# Patient Record
Sex: Male | Born: 1980 | Race: Black or African American | Hispanic: No | Marital: Single | Smoking: Never smoker
Health system: Southern US, Community
[De-identification: ages and names within clinical notes are randomized; demographics above are authoritative.]

## PROBLEM LIST (undated history)

## (undated) DIAGNOSIS — K219 Gastro-esophageal reflux disease without esophagitis: Secondary | ICD-10-CM

---

## 2007-07-11 ENCOUNTER — Emergency Department: Payer: Self-pay | Admitting: Emergency Medicine

## 2007-07-17 ENCOUNTER — Emergency Department: Payer: Self-pay | Admitting: Emergency Medicine

## 2010-08-09 ENCOUNTER — Emergency Department: Payer: Self-pay | Admitting: Emergency Medicine

## 2014-08-28 ENCOUNTER — Emergency Department
Admission: EM | Admit: 2014-08-28 | Discharge: 2014-08-28 | Disposition: A | Discharge status: Home / Self Care | Payer: Medicare Other | Attending: Emergency Medicine | Admitting: Emergency Medicine

## 2014-08-28 ENCOUNTER — Encounter: Payer: Self-pay | Admitting: Emergency Medicine

## 2014-08-28 DIAGNOSIS — K088 Other specified disorders of teeth and supporting structures: Secondary | ICD-10-CM | POA: Diagnosis present

## 2014-08-28 DIAGNOSIS — K047 Periapical abscess without sinus: Secondary | ICD-10-CM | POA: Insufficient documentation

## 2014-08-28 DIAGNOSIS — K002 Abnormalities of size and form of teeth: Secondary | ICD-10-CM | POA: Insufficient documentation

## 2014-08-28 DIAGNOSIS — K029 Dental caries, unspecified: Secondary | ICD-10-CM | POA: Insufficient documentation

## 2014-08-28 MED ORDER — AMOXICILLIN 500 MG PO CAPS: 500.0000 mg | ORAL_CAPSULE | Freq: Three times a day (TID) | ORAL | Status: DC

## 2014-08-28 MED ORDER — IBUPROFEN 800 MG PO TABS: 800.0000 mg | ORAL_TABLET | Freq: Three times a day (TID) | ORAL | Status: DC | PRN

## 2014-08-28 NOTE — Discharge Instructions (Signed)

## 2014-08-28 NOTE — ED Provider Notes (Signed)
CSN: [id number]     Arrival date & time 08/28/14  7829 History   First MD Initiated Contact with Patient 08/28/14 0757     Chief Complaint  Patient presents with  . Dental Pain    HPI Comments: 34 year old male complains of left jaw pain for the past week. Patient reports that over time his face has started to swell. He has not had any fevers, ear pain, sinus congestion. He has not had any dental trauma and has no known caries. Has not taken anything for pain.  Patient is a 34 y.o. male presenting with tooth pain. The history is provided by the patient.  Dental Pain Location:  Lower Lower teeth location:  17/LL 3rd molar Quality:  Aching Severity:  Moderate Onset quality:  Gradual Duration:  7 days Timing:  Constant Progression:  Unchanged Chronicity:  New Context: dental caries and poor dentition   Context: not dental fracture, not malocclusion, not recent dental surgery and not trauma   Relieved by:  None tried Worsened by:  Hot food/drink, touching, cold food/drink, jaw movement and pressure Ineffective treatments:  None tried Associated symptoms: facial pain, facial swelling and gum swelling   Associated symptoms: no fever, no neck swelling, no oral bleeding and no oral lesions     History reviewed. No pertinent past medical history. History reviewed. No pertinent past surgical history. History reviewed. No pertinent family history. History  Substance Use Topics  . Smoking status: Never Smoker   . Smokeless tobacco: Not on file  . Alcohol Use: No    Review of Systems  Constitutional: Negative for fever and chills.  HENT: Positive for facial swelling. Negative for mouth sores.   All other systems reviewed and are negative.     Allergies  Review of patient's allergies indicates no known allergies.  Home Medications   Prior to Admission medications   Medication Sig Start Date End Date Taking? Authorizing Provider  amoxicillin (AMOXIL) 500 MG capsule Take 1  capsule (500 mg total) by mouth 3 (three) times daily. 08/28/14   Luvenia Redden, PA-C  ibuprofen (ADVIL,MOTRIN) 800 MG tablet Take 1 tablet (800 mg total) by mouth every 8 (eight) hours as needed. 08/28/14   Wilber Oliphant V, PA-C   BP 126/76 mmHg  Temp(Src) 97.8 F (36.6 C) (Oral)  Resp 18  Ht  (1.753 m)  Wt 236 lb (107.049 kg)  BMI 34.84 kg/m2  SpO2 98% Physical Exam  Constitutional: He is oriented to person, place, and time. Vital signs are normal. He appears well-developed and well-nourished. He is active.  Non-toxic appearance. He does not have a sickly appearance. He does not appear ill.  HENT:  Head: Normocephalic and atraumatic.  Right Ear: Tympanic membrane and external ear normal.  Left Ear: Tympanic membrane and external ear normal.  Nose: Nose normal.  Mouth/Throat: Uvula is midline, oropharynx is clear and moist and mucous membranes are normal. Abnormal dentition. Dental abscesses and dental caries present.    Eyes: Conjunctivae and EOM are normal. Pupils are equal, round, and reactive to light.  Neck: Normal range of motion. Neck supple.  Lymphadenopathy:    He has no cervical adenopathy.  Neurological: He is alert and oriented to person, place, and time.  Skin: Skin is warm and dry.  Psychiatric: He has a normal mood and affect. His behavior is normal. Judgment and thought content normal.  Nursing note and vitals reviewed.   ED Course  Procedures (including critical care time) Labs Review Labs  Reviewed - No data to display  Imaging Review No results found.   EKG Interpretation None      MDM  Pt given dental follow up information - amox course and motrin TId with food Final diagnoses:  Dental abscess     Luvenia ReddenEmma Weavil V, PA-C 08/28/14 0902  Wilber OliphantEmma Weavil V, PA-C 08/28/14 [id number]  Governor Rooksebecca Lord, MD 08/28/14 1023

## 2014-08-28 NOTE — ED Notes (Signed)
Tooth ache on left side of his jaw. He reports that his ear and head are starting to hurt. Slight swelling seen.

## 2019-02-07 ENCOUNTER — Other Ambulatory Visit: Payer: Self-pay

## 2019-02-07 ENCOUNTER — Emergency Department: Payer: Medicare Other

## 2019-02-07 ENCOUNTER — Encounter: Payer: Self-pay | Admitting: Intensive Care

## 2019-02-07 ENCOUNTER — Emergency Department
Admission: EM | Admit: 2019-02-07 | Discharge: 2019-02-07 | Disposition: A | Discharge status: Home / Self Care | Payer: Medicare Other | Attending: Emergency Medicine | Admitting: Emergency Medicine

## 2019-02-07 DIAGNOSIS — R079 Chest pain, unspecified: Secondary | ICD-10-CM | POA: Diagnosis not present

## 2019-02-07 LAB — CBC
HCT: 39.5 % (ref 39.0–52.0)
Hemoglobin: 13.7 g/dL (ref 13.0–17.0)
MCH: 28.8 pg (ref 26.0–34.0)
MCHC: 34.7 g/dL (ref 30.0–36.0)
MCV: 83.2 fL (ref 80.0–100.0)
Platelets: 216 10*3/uL (ref 150–400)
RBC: 4.75 MIL/uL (ref 4.22–5.81)
RDW: 12 % (ref 11.5–15.5)
WBC: 4.8 10*3/uL (ref 4.0–10.5)
nRBC: 0 % (ref 0.0–0.2)

## 2019-02-07 LAB — BASIC METABOLIC PANEL
Anion gap: 9 (ref 5–15)
BUN: 10 mg/dL (ref 6–20)
CO2: 27 mmol/L (ref 22–32)
Calcium: 9.1 mg/dL (ref 8.9–10.3)
Chloride: 104 mmol/L (ref 98–111)
Creatinine, Ser: 0.91 mg/dL (ref 0.61–1.24)
GFR calc Af Amer: >60 mL/min (ref >60)
GFR calc non Af Amer: >60 mL/min (ref >60)
Glucose, Bld: 84 mg/dL (ref 70–99)
Potassium: 3.2 mmol/L — ABNORMAL LOW (ref 3.5–5.1)
Sodium: 140 mmol/L (ref 135–145)

## 2019-02-07 LAB — TROPONIN I (HIGH SENSITIVITY)
Troponin I (High Sensitivity): 6 ng/L (ref <18)
Troponin I (High Sensitivity): 6 ng/L (ref <18)

## 2019-02-07 MED ORDER — FAMOTIDINE 20 MG PO TABS
40.0000 mg | ORAL_TABLET | Freq: Once | ORAL | Status: AC
  Administered 2019-02-07: 40 mg via ORAL
  Filled 2019-02-07: qty 2

## 2019-02-07 NOTE — ED Triage Notes (Signed)
Patient c/o central/left sided chest tightness that started last night. Radiation down left arm

## 2019-02-07 NOTE — ED Triage Notes (Signed)
First RN Note: Pt presents to ED via wheelchair from [facility] with c/o L sided CP that radiates down the L arm. Pt presents alert and oriented at this time, respirations even and unlabored.

## 2019-02-07 NOTE — ED Provider Notes (Signed)
Emergency Department Provider Note  ____________________________________________  Time seen: Approximately 3:49 PM  I have reviewed the triage vital signs and the nursing notes.   HISTORY  Chief Complaint Chest Pain   Historian Patient    HPI Dwayne Macdonald is a 38 y.o. male with an unremarkable past medical history, presents to the emergency department with midsternal chest pain that started last night.  When asked question about association of pain with eating, patient states that he has been eating more pizza lately and did notice discomfort after he had pizza.  He states that his chest pain stays the same with exertion that he can still feel discomfort when he is in a supine position.  He denies nausea, vomiting or abdominal pain.  He has been afebrile.  He denies a history of cardiac issues.  Chest pain does not worsen with deep inspiration.  No recent travel, prolonged immobilization or recent surgery.  Patient does not recall having similar episodes of chest pain in the past.  He is a non-smoker and does not take any antihypertensives.   History reviewed. No pertinent past medical history.   Immunizations up to date:  Yes.     History reviewed. No pertinent past medical history.  There are no problems to display for this patient.   History reviewed. No pertinent surgical history.  Prior to Admission medications   Medication Sig Start Date End Date Taking? Authorizing Provider  amoxicillin (AMOXIL) 500 MG capsule Take 1 capsule (500 mg total) by mouth 3 (three) times daily. 08/28/14   Harvest Dark, PA-C  ibuprofen (ADVIL,MOTRIN) 800 MG tablet Take 1 tablet (800 mg total) by mouth every 8 (eight) hours as needed. 08/28/14   Harvest Dark, PA-C    Allergies Patient has no known allergies.  History reviewed. No pertinent family history.  Social History Social History   Tobacco Use  . Smoking status: Never Smoker  . Smokeless tobacco: Never Used   Substance Use Topics  . Alcohol use: No  . Drug use: Never     Review of Systems  Constitutional: No fever/chills Eyes:  No discharge ENT: No upper respiratory complaints. Respiratory: no cough. No SOB/ use of accessory muscles to breath Cardiac: Patient has mid-sternal chest pain.  Gastrointestinal:   No nausea, no vomiting.  No diarrhea.  No constipation. Musculoskeletal: Negative for musculoskeletal pain. Skin: Negative for rash, abrasions, lacerations, ecchymosis.    ____________________________________________   PHYSICAL EXAM:  VITAL SIGNS: ED Triage Vitals [02/07/19 1310]  Enc Vitals Group     BP (!) 139/95     Pulse Rate 84     Resp 16     Temp 98.3 F (36.8 C)     Temp Source Oral     SpO2 100 %     Weight 238 lb (108 kg)     Height 5\' 9"  (1.753 m)     Head Circumference      Peak Flow      Pain Score 5     Pain Loc      Pain Edu?      Excl. in [place]?      Constitutional: Alert and oriented. Well appearing and in no acute distress. Eyes: Conjunctivae are normal. PERRL. EOMI. Head: Atraumatic. ENT:      Ears: TMs are pearly.       Nose: No congestion/rhinnorhea.      Mouth/Throat: Mucous membranes are moist.  Neck: No stridor.  No cervical spine tenderness to palpation.  Cardiovascular: Normal rate, regular rhythm. Normal S1 and S2.  Good peripheral circulation.  No reproducible anterior chest wall pain to discomfort. Respiratory: Normal respiratory effort without tachypnea or retractions. Lungs CTAB. Good air entry to the bases with no decreased or absent breath sounds Gastrointestinal: Bowel sounds x 4 quadrants. Soft and nontender to palpation. No guarding or rigidity. No distention. Musculoskeletal: Full range of motion to all extremities. No obvious deformities noted Neurologic:  Normal for age. No gross focal neurologic deficits are appreciated.  Skin:  Skin is warm, dry and intact. No rash noted. Psychiatric: Mood and affect are normal for age.  Speech and behavior are normal.   ____________________________________________   LABS (all labs ordered are listed, but only abnormal results are displayed)  Labs Reviewed  BASIC METABOLIC PANEL - Abnormal; Notable for the following components:      Result Value   Potassium 3.2 (*)    All other components within normal limits  CBC  TROPONIN I (HIGH SENSITIVITY)  TROPONIN I (HIGH SENSITIVITY)   ____________________________________________  EKG   ____________________________________________  RADIOLOGY Geraldo Pitter, personally viewed and evaluated these images (plain radiographs) as part of my medical decision making, as well as reviewing the written report by the radiologist.  DG Chest 2 View  Result Date: 02/07/2019 CLINICAL DATA:  Onset left chest pain radiating into the left arm last night. EXAM: CHEST - 2 VIEW COMPARISON:  None. FINDINGS: Lungs clear. Heart size normal. No pneumothorax or pleural fluid. No acute or focal bony abnormality. IMPRESSION: Negative chest. Electronically Signed   By: Drusilla Kanner M.D.   On: 02/07/2019 13:49    ____________________________________________    PROCEDURES  Procedure(s) performed:     Procedures     Medications  famotidine (PEPCID) tablet 40 mg (40 mg Oral Given 02/07/19 1552)     ____________________________________________   INITIAL IMPRESSION / ASSESSMENT AND PLAN / ED COURSE  Pertinent labs & imaging results that were available during my care of the patient were reviewed by me and considered in my medical decision making (see chart for details).    Assessment and Plan: Nonspecific chest pain  38 year old male with an unremarkable past medical history, presents to the emergency department with midsternal chest pain that started last night after patient had pizza.  Vital signs were reassuring at triage.  On physical exam, patient was resting comfortably in a supine position.  He had no increased work  of breathing.  No murmurs were auscultated on exam.  Differential diagnosis included STEMI, arrhythmia, GERD, COVID-19 pneumothorax PE...  EKG was reviewed which indicated normal sinus rhythm without ischemic changes or apparent arrhythmia.  Both sets of troponin were within reference range.  No evidence of cardiac enlargement, pneumothorax, consolidations or opacities on chest x-ray.  Patient is low risk for PE given Wells criteria.  Will administer famotidine and will reassess.  Patient feels much improved after Pepcid.  Patient was given strict return precautions to return with new or worsening symptoms.  All patient questions were answered.  ____________________________________________  FINAL CLINICAL IMPRESSION(S) / ED DIAGNOSES  Final diagnoses:  Chest pain, unspecified type      NEW MEDICATIONS STARTED DURING THIS VISIT:  ED Discharge Orders    None          This chart was dictated using voice recognition software/Dragon. Despite best efforts to proofread, errors can occur which can change the meaning. Any change was purely unintentional.     Orvil Feil, PA-C 02/07/19  1637    Emily FilbertWilliams, Jonathan E, MD 02/07/19 [phone]

## 2020-04-28 ENCOUNTER — Encounter: Payer: Self-pay | Admitting: Emergency Medicine

## 2020-04-28 ENCOUNTER — Emergency Department
Admission: EM | Admit: 2020-04-28 | Discharge: 2020-04-28 | Disposition: A | Discharge status: Home / Self Care | Payer: Medicare Other | Attending: Emergency Medicine | Admitting: Emergency Medicine

## 2020-04-28 ENCOUNTER — Emergency Department: Payer: Medicare Other

## 2020-04-28 ENCOUNTER — Other Ambulatory Visit: Payer: Self-pay

## 2020-04-28 DIAGNOSIS — M940 Chondrocostal junction syndrome [Tietze]: Secondary | ICD-10-CM | POA: Diagnosis not present

## 2020-04-28 DIAGNOSIS — R079 Chest pain, unspecified: Secondary | ICD-10-CM | POA: Diagnosis present

## 2020-04-28 LAB — BASIC METABOLIC PANEL
Anion gap: 8 (ref 5–15)
BUN: 7 mg/dL (ref 6–20)
CO2: 26 mmol/L (ref 22–32)
Calcium: 9.3 mg/dL (ref 8.9–10.3)
Chloride: 105 mmol/L (ref 98–111)
Creatinine, Ser: 0.98 mg/dL (ref 0.61–1.24)
GFR, Estimated: >60 mL/min (ref >60)
Glucose, Bld: 93 mg/dL (ref 70–99)
Potassium: 3.3 mmol/L — ABNORMAL LOW (ref 3.5–5.1)
Sodium: 139 mmol/L (ref 135–145)

## 2020-04-28 LAB — CBC
HCT: 37.6 % — ABNORMAL LOW (ref 39.0–52.0)
Hemoglobin: 13 g/dL (ref 13.0–17.0)
MCH: 29.5 pg (ref 26.0–34.0)
MCHC: 34.6 g/dL (ref 30.0–36.0)
MCV: 85.5 fL (ref 80.0–100.0)
Platelets: 230 10*3/uL (ref 150–400)
RBC: 4.4 MIL/uL (ref 4.22–5.81)
RDW: 11.9 % (ref 11.5–15.5)
WBC: 3.7 10*3/uL — ABNORMAL LOW (ref 4.0–10.5)
nRBC: 0 % (ref 0.0–0.2)

## 2020-04-28 LAB — TROPONIN I (HIGH SENSITIVITY): Troponin I (High Sensitivity): 6 ng/L (ref <18)

## 2020-04-28 MED ORDER — ONDANSETRON HCL 4 MG/2ML IJ SOLN: 4.0000 mg | Freq: Once | INTRAMUSCULAR | Status: DC

## 2020-04-28 MED ORDER — IBUPROFEN 600 MG PO TABS: 600.0000 mg | ORAL_TABLET | Freq: Four times a day (QID) | ORAL | 0 refills | Status: DC | PRN

## 2020-04-28 MED ORDER — POTASSIUM CHLORIDE CRYS ER 20 MEQ PO TBCR
40.0000 meq | EXTENDED_RELEASE_TABLET | Freq: Once | ORAL | Status: AC
  Administered 2020-04-28: 40 meq via ORAL
  Filled 2020-04-28: qty 2

## 2020-04-28 MED ORDER — SODIUM CHLORIDE 0.9 % IV BOLUS: 1000.0000 mL | Freq: Once | INTRAVENOUS | Status: DC

## 2020-04-28 MED ORDER — HYDROMORPHONE HCL 1 MG/ML IJ SOLN: 1.0000 mg | Freq: Once | INTRAMUSCULAR | Status: DC

## 2020-04-28 NOTE — ED Provider Notes (Signed)
[facility] Emergency Department Provider Note  ____________________________________________   Event Date/Time   First MD Initiated Contact with Patient 04/28/20 1937     (approximate)  I have reviewed the triage vital signs and the nursing notes.   HISTORY  Chief Complaint Chest Pain    HPI Dwayne Macdonald is a 40 y.o. male no prior heart history who comes in with chest pain.  Patient reports having chest pain since yesterday.  States that it started after doing some push-ups.  The chest pain is intermittent last for a few seconds, worse with pushing on his chest, has not take anything to help with the pain.  Denies any shortness of breath, coughing, fevers.  No abdominal pain.  Denies any other risk factors for PE.          History reviewed. No pertinent past medical history.  There are no problems to display for this patient.   History reviewed. No pertinent surgical history.  Prior to Admission medications   Medication Sig Start Date End Date Taking? Authorizing Provider  amoxicillin (AMOXIL) 500 MG capsule Take 1 capsule (500 mg total) by mouth 3 (three) times daily. 08/28/14   Christella Scheuermann, PA-C  ibuprofen (ADVIL,MOTRIN) 800 MG tablet Take 1 tablet (800 mg total) by mouth every 8 (eight) hours as needed. 08/28/14   Christella Scheuermann, PA-C    Allergies Patient has no known allergies.  No family history on file.  Social History Social History   Tobacco Use  . Smoking status: Never Smoker  . Smokeless tobacco: Never Used  Substance Use Topics  . Alcohol use: No  . Drug use: Never      Review of Systems Constitutional: No fever/chills Eyes: No visual changes. ENT: No sore throat. Cardiovascular: Positive chest pain Respiratory: Denies shortness of breath. Gastrointestinal: No abdominal pain.  No nausea, no vomiting.  No diarrhea.  No constipation. Genitourinary: Negative for dysuria. Musculoskeletal: Negative for back  pain. Skin: Negative for rash. Neurological: Negative for headaches, focal weakness or numbness. All other ROS negative ____________________________________________   PHYSICAL EXAM:  VITAL SIGNS: ED Triage Vitals  Enc Vitals Group     BP 04/28/20 1831 137/86     Pulse Rate 04/28/20 1831 84     Resp 04/28/20 1831 17     Temp 04/28/20 1831 97.7 F (36.5 C)     Temp Source 04/28/20 1831 Oral     SpO2 04/28/20 1831 100 %     Weight 04/28/20 1709 230 lb (104.3 kg)     Height 04/28/20 1709 6' (1.829 m)     Head Circumference --      Peak Flow --      Pain Score 04/28/20 1709 5     Pain Loc --      Pain Edu? --      Excl. in [place]? --     Constitutional: Alert and oriented. Well appearing and in no acute distress. Eyes: Conjunctivae are normal. EOMI. Head: Atraumatic. Nose: No congestion/rhinnorhea. Mouth/Throat: Mucous membranes are moist.   Neck: No stridor. Trachea Midline. FROM Cardiovascular: Normal rate, regular rhythm. Grossly normal heart sounds.  Good peripheral circulation.  Chest wall tenderness midsternal, no rash Respiratory: Normal respiratory effort.  No retractions. Lungs CTAB. Gastrointestinal: Soft and nontender. No distention. No abdominal bruits.  Musculoskeletal: No lower extremity tenderness nor edema.  No joint effusions. Neurologic:  Normal speech and language. No gross focal neurologic deficits are appreciated.  Skin:  Skin  is warm, dry and intact. No rash noted. Psychiatric: Mood and affect are normal. Speech and behavior are normal. GU: Deferred   ____________________________________________   LABS (all labs ordered are listed, but only abnormal results are displayed)  Labs Reviewed  BASIC METABOLIC PANEL - Abnormal; Notable for the following components:      Result Value   Potassium 3.3 (*)    All other components within normal limits  CBC - Abnormal; Notable for the following components:   WBC 3.7 (*)    HCT 37.6 (*)    All other components  within normal limits  TROPONIN I (HIGH SENSITIVITY)  TROPONIN I (HIGH SENSITIVITY)   ____________________________________________   ED ECG REPORT I, Concha Se, the attending physician, personally viewed and interpreted this ECG.  Normal sinus rate of 88, no ST elevation, no T wave inversions, normal intervals ____________________________________________  RADIOLOGY Vela Prose, personally viewed and evaluated these images (plain radiographs) as part of my medical decision making, as well as reviewing the written report by the radiologist.  ED MD interpretation: No pneumonia noted  Official radiology report(s): DG Chest 2 View  Result Date: 04/28/2020 CLINICAL DATA:  Chest pain. EXAM: CHEST - 2 VIEW COMPARISON:  Prior chest radiographs 02/07/2019 FINDINGS: Heart size within normal limits. No appreciable airspace consolidation. No evidence of pleural effusion or pneumothorax. No acute bony abnormality identified. IMPRESSION: No evidence of active cardiopulmonary disease. Electronically Signed   By: Jackey Loge DO   On: 04/28/2020 17:48    ____________________________________________   PROCEDURES  Procedure(s) performed (including Critical Care):  Procedures   ____________________________________________   INITIAL IMPRESSION / ASSESSMENT AND PLAN / ED COURSE   Dwayne Macdonald was evaluated in Emergency Department on 04/28/2020 for the symptoms described in the history of present illness. He was evaluated in the context of the global COVID-19 pandemic, which necessitated consideration that the patient might be at risk for infection with the SARS-CoV-2 virus that causes COVID-19. Institutional protocols and algorithms that pertain to the evaluation of patients at risk for COVID-19 are in a state of rapid change based on information released by regulatory bodies including the CDC and federal and state organizations. These policies and algorithms were followed during the  patient's care in the ED.    Most Likely DDx:  -MSK (atypical chest pain) but will get cardiac markers to evaluate for ACS given risk factors/age    DDx that was also considered d/t potential to cause harm, but was found less likely based on history and physical (as detailed above): -PNA (no fevers, cough but CXR to evaluate) -PNX (reassured with equal b/l breath sounds, CXR to evaluate) -Symptomatic anemia (will get H&H) -Pulmonary embolism as no sob at rest, not pleuritic in nature, no hypoxia, PERC negative -Aortic Dissection as no tearing pain and no radiation to the mid back, pulses equal -Pericarditis no rub on exam, EKG changes or hx to suggest dx -Tamponade (no notable SOB, tachycardic, hypotensive) -Esophageal rupture (no h/o diffuse vomitting/no crepitus) -No upper abdominal pain on palpation to suggest gallbladder pathology, gastritis.  Cardiac marker was negative and patient is a low heart score and chest pain is been going on for over a day.  I do not feel the patient needs repeat given my low suspicion given is very musculoskeletal in nature.  White count slightly low but no signs of infection.  Potassium also slightly low and will give some oral repletion.  Patient can have this followed up with  his primary care doctor      ____________________________________________   FINAL CLINICAL IMPRESSION(S) / ED DIAGNOSES   Final diagnoses:  Costochondritis     MEDICATIONS GIVEN DURING THIS VISIT:  Medications  potassium chloride SA (KLOR-CON) CR tablet 40 mEq (has no administration in time range)     ED Discharge Orders         Ordered    ibuprofen (ADVIL) 600 MG tablet  Every 6 hours PRN        04/28/20 1958           Note:  This document was prepared using Dragon voice recognition software and may include unintentional dictation errors.   Concha Se, MD 04/28/20 [place]

## 2020-04-28 NOTE — ED Triage Notes (Addendum)
Patient presents to the ED with intermittent chest pain that began yesterday.  Patient states pain is sharp and is on the center and left side of his chest.  Patient reports tenderness to the middle of his chest on palpation.  Patient is in no obvious distress at this time and denies any cardiac history.  Patient reports performing 10 push ups last night.

## 2020-04-28 NOTE — Discharge Instructions (Signed)
We provide an anti-inflammatory to help with your chest pain.  Take this with food.  Return to the ER if you develop worsening shortness of breath or any other concerns.  Your white count and potassium are slightly low.  He can follow this up with your primary care doctor in 1 week.

## 2020-05-01 ENCOUNTER — Emergency Department: Payer: Medicare Other

## 2020-05-01 ENCOUNTER — Other Ambulatory Visit: Payer: Self-pay

## 2020-05-01 DIAGNOSIS — R0789 Other chest pain: Secondary | ICD-10-CM | POA: Diagnosis present

## 2020-05-01 LAB — BASIC METABOLIC PANEL
Anion gap: 8 (ref 5–15)
BUN: 10 mg/dL (ref 6–20)
CO2: 27 mmol/L (ref 22–32)
Calcium: 9.1 mg/dL (ref 8.9–10.3)
Chloride: 106 mmol/L (ref 98–111)
Creatinine, Ser: 1.06 mg/dL (ref 0.61–1.24)
GFR, Estimated: >60 mL/min (ref >60)
Glucose, Bld: 99 mg/dL (ref 70–99)
Potassium: 3.5 mmol/L (ref 3.5–5.1)
Sodium: 141 mmol/L (ref 135–145)

## 2020-05-01 LAB — CBC
HCT: 36.8 % — ABNORMAL LOW (ref 39.0–52.0)
Hemoglobin: 12.3 g/dL — ABNORMAL LOW (ref 13.0–17.0)
MCH: 29.1 pg (ref 26.0–34.0)
MCHC: 33.4 g/dL (ref 30.0–36.0)
MCV: 87 fL (ref 80.0–100.0)
Platelets: 212 10*3/uL (ref 150–400)
RBC: 4.23 MIL/uL (ref 4.22–5.81)
RDW: 11.9 % (ref 11.5–15.5)
WBC: 3.9 10*3/uL — ABNORMAL LOW (ref 4.0–10.5)
nRBC: 0 % (ref 0.0–0.2)

## 2020-05-01 LAB — TROPONIN I (HIGH SENSITIVITY): Troponin I (High Sensitivity): 8 ng/L (ref <18)

## 2020-05-01 NOTE — ED Triage Notes (Signed)
Pt to ED via EMS from home. Pt states for the past 2 days he has had intermittent sharp chest pain, that goes from left chest to neck. Pt has no cardiac hx.

## 2020-05-02 ENCOUNTER — Emergency Department
Admission: EM | Admit: 2020-05-02 | Discharge: 2020-05-02 | Disposition: A | Discharge status: Home / Self Care | Payer: Medicare Other | Attending: Emergency Medicine | Admitting: Emergency Medicine

## 2020-05-02 DIAGNOSIS — R0789 Other chest pain: Secondary | ICD-10-CM | POA: Diagnosis not present

## 2020-05-02 LAB — TROPONIN I (HIGH SENSITIVITY): Troponin I (High Sensitivity): 8 ng/L (ref <18)

## 2020-05-02 NOTE — ED Provider Notes (Signed)
[facility] Emergency Department Provider Note  ____________________________________________   Event Date/Time   First MD Initiated Contact with Patient 05/02/20 0101     (approximate)  I have reviewed the triage vital signs and the nursing notes.   HISTORY  Chief Complaint Chest Pain    HPI Dwayne Macdonald is a 40 y.o. male reports no chronic medical issues and presents for evaluation of some sharp or sometimes aching pain in his anterior chest.  He said it started 2 days ago after doing 10 push-ups.  He said the pain seems to be moving a little bit, starting in the middle and moving up to the left part of his chest over the last couple of days.  He said he does not feel it at all right now but he can feel it if he pushes on his chest.  He has had no other symptoms; specifically he denies fever, sore throat, shortness of breath, cough, nausea, vomiting, abdominal pain.  He has no pain in his arms or his legs.  The onset of the pain was acute and it is mild, and nothing in particular makes it better and pushing on his chest wall seems to make it worse.  He reports no history of high blood pressure, diabetes, high cholesterol, smoking, nor drug use.  He said no one in his family  has had heart attacks of which he is aware.        History reviewed. No pertinent past medical history.  There are no problems to display for this patient.   History reviewed. No pertinent surgical history.  Prior to Admission medications   Medication Sig Start Date End Date Taking? Authorizing Provider  amoxicillin (AMOXIL) 500 MG capsule Take 1 capsule (500 mg total) by mouth 3 (three) times daily. 08/28/14   Christella Scheuermann, PA-C  ibuprofen (ADVIL) 600 MG tablet Take 1 tablet (600 mg total) by mouth every 6 (six) hours as needed for up to 7 days. 04/28/20 05/05/20  Concha Se, MD    Allergies Patient has no known allergies.  No family history on file.  Social  History Social History   Tobacco Use  . Smoking status: Never Smoker  . Smokeless tobacco: Never Used  Substance Use Topics  . Alcohol use: No  . Drug use: Never    Review of Systems Constitutional: No fever/chills Eyes: No visual changes. ENT: No sore throat. Cardiovascular: Anterior chest wall pain. Respiratory: Denies shortness of breath. Gastrointestinal: No abdominal pain.  No nausea, no vomiting.  No diarrhea.  No constipation. Genitourinary: Negative for dysuria. Musculoskeletal: Negative for neck pain.  Negative for back pain. Integumentary: Negative for rash. Neurological: Negative for headaches, focal weakness or numbness.   ____________________________________________   PHYSICAL EXAM:  VITAL SIGNS: ED Triage Vitals  Enc Vitals Group     BP 05/01/20 2131 134/87     Pulse Rate 05/01/20 2131 80     Resp 05/01/20 2131 18     Temp 05/01/20 2131 98.3 F (36.8 C)     Temp Source 05/01/20 2348 Oral     SpO2 05/01/20 2131 98 %     Weight 05/01/20 2129 104.3 kg (230 lb)     Height 05/01/20 2129 1.829 m (6')     Head Circumference --      Peak Flow --      Pain Score 05/01/20 2129 0     Pain Loc --      Pain Edu? --  Excl. in GC? --     Constitutional: Alert and oriented.  Eyes: Conjunctivae are normal.  Head: Atraumatic. Nose: No congestion/rhinnorhea. Mouth/Throat: Patient is wearing a mask. Neck: No stridor.  No meningeal signs.   Cardiovascular: Normal rate, regular rhythm. Good peripheral circulation. Respiratory: Normal respiratory effort.  No retractions. Gastrointestinal: Soft and nontender. No distention.  Musculoskeletal: Patient has some mild reproducible tenderness palpation of the left anterior chest wall.  No lower extremity tenderness nor edema. No gross deformities of extremities. Neurologic:  Normal speech and language. No gross focal neurologic deficits are appreciated.  Skin:  Skin is warm, dry and intact. Psychiatric: Mood and  affect are normal. Speech and behavior are normal.  ____________________________________________   LABS (all labs ordered are listed, but only abnormal results are displayed)  Labs Reviewed  CBC - Abnormal; Notable for the following components:      Result Value   WBC 3.9 (*)    Hemoglobin 12.3 (*)    HCT 36.8 (*)    All other components within normal limits  BASIC METABOLIC PANEL  TROPONIN I (HIGH SENSITIVITY)  TROPONIN I (HIGH SENSITIVITY)   ____________________________________________  EKG  ED ECG REPORT I, Loleta Rose, the attending physician, personally viewed and interpreted this ECG.  Date: 05/01/2020 EKG Time: 21: 37 Rate: 80 Rhythm: normal sinus rhythm QRS Axis: normal Intervals: normal ST/T Wave abnormalities: normal Narrative Interpretation: no evidence of acute ischemia  ____________________________________________  RADIOLOGY I, Loleta Rose, personally viewed and evaluated these images (plain radiographs) as part of my medical decision making, as well as reviewing the written report by the radiologist.  ED MD interpretation: No acute abnormalities on chest x-ray  Official radiology report(s): DG Chest 2 View  Result Date: 05/01/2020 CLINICAL DATA:  Intermittent sharp chest pain radiating to neck EXAM: CHEST - 2 VIEW COMPARISON:  04/28/2020 FINDINGS: The heart size and mediastinal contours are within normal limits. Both lungs are clear. The visualized skeletal structures are unremarkable. IMPRESSION: No active cardiopulmonary disease. Electronically Signed   By: Sharlet Salina M.D.   On: 05/01/2020 22:03    ____________________________________________   PROCEDURES   Procedure(s) performed (including Critical Care):  Procedures   ____________________________________________   INITIAL IMPRESSION / MDM / ASSESSMENT AND PLAN / ED COURSE  As part of my medical decision making, I reviewed the following data within the electronic MEDICAL RECORD NUMBER  Nursing notes reviewed and incorporated, Labs reviewed , EKG interpreted , Old chart reviewed, Radiograph reviewed  and Notes from prior ED visits   Differential diagnosis includes, but is not limited to, musculoskeletal strain, costochondritis, ACS, PE, pneumonia.  Patient is well-appearing and in no distress.  Vital signs are stable and within normal limits. Two HS troponins of 8.  Basic metabolic panel and CBC are essentially normal.  I personally reviewed the patient's imaging and agree with the radiologist's interpretation that there are no acute abnormalities on the chest x-ray.  EKG is normal with no sign of ischemia.  Patient has reproducible chest wall tenderness that started after he did some push-ups.  Low risk for ACS based on HEAR score.  PERC negative.  No indication of an emergent medical condition tonight.  I encouraged the patient to take some ibuprofen for his chest wall pain and follow-up with his regular doctor.  I explained to him that there is no evidence of an emergent condition tonight and he said that he understood.  I gave my usual and customary return precautions.  ____________________________________________  FINAL CLINICAL IMPRESSION(S) / ED DIAGNOSES  Final diagnoses:  Chest wall pain     MEDICATIONS GIVEN DURING THIS VISIT:  Medications - No data to display   ED Discharge Orders    None      *Please note:  Dwayne Macdonald was evaluated in Emergency Department on 05/02/2020 for the symptoms described in the history of present illness. He was evaluated in the context of the global COVID-19 pandemic, which necessitated consideration that the patient might be at risk for infection with the SARS-CoV-2 virus that causes COVID-19. Institutional protocols and algorithms that pertain to the evaluation of patients at risk for COVID-19 are in a state of rapid change based on information released by regulatory bodies including the CDC and federal and state  organizations. These policies and algorithms were followed during the patient's care in the ED.  Some ED evaluations and interventions may be delayed as a result of limited staffing during and after the pandemic.*  Note:  This document was prepared using Dragon voice recognition software and may include unintentional dictation errors.   Loleta Rose, MD 05/02/20 [phone]

## 2020-05-02 NOTE — Discharge Instructions (Addendum)

## 2020-05-03 ENCOUNTER — Emergency Department: Payer: Medicare Other

## 2020-05-03 ENCOUNTER — Emergency Department
Admission: EM | Admit: 2020-05-03 | Discharge: 2020-05-03 | Disposition: A | Discharge status: Home / Self Care | Payer: Medicare Other | Attending: Emergency Medicine | Admitting: Emergency Medicine

## 2020-05-03 ENCOUNTER — Other Ambulatory Visit: Payer: Self-pay

## 2020-05-03 DIAGNOSIS — R079 Chest pain, unspecified: Secondary | ICD-10-CM | POA: Diagnosis present

## 2020-05-03 DIAGNOSIS — K219 Gastro-esophageal reflux disease without esophagitis: Secondary | ICD-10-CM | POA: Insufficient documentation

## 2020-05-03 LAB — CBC
HCT: 36.8 % — ABNORMAL LOW (ref 39.0–52.0)
Hemoglobin: 12.7 g/dL — ABNORMAL LOW (ref 13.0–17.0)
MCH: 29.7 pg (ref 26.0–34.0)
MCHC: 34.5 g/dL (ref 30.0–36.0)
MCV: 86 fL (ref 80.0–100.0)
Platelets: 215 10*3/uL (ref 150–400)
RBC: 4.28 MIL/uL (ref 4.22–5.81)
RDW: 12.1 % (ref 11.5–15.5)
WBC: 3.6 10*3/uL — ABNORMAL LOW (ref 4.0–10.5)
nRBC: 0 % (ref 0.0–0.2)

## 2020-05-03 LAB — BASIC METABOLIC PANEL
Anion gap: 8 (ref 5–15)
BUN: 7 mg/dL (ref 6–20)
CO2: 25 mmol/L (ref 22–32)
Calcium: 9.2 mg/dL (ref 8.9–10.3)
Chloride: 108 mmol/L (ref 98–111)
Creatinine, Ser: 0.91 mg/dL (ref 0.61–1.24)
GFR, Estimated: >60 mL/min (ref >60)
Glucose, Bld: 99 mg/dL (ref 70–99)
Potassium: 3.3 mmol/L — ABNORMAL LOW (ref 3.5–5.1)
Sodium: 141 mmol/L (ref 135–145)

## 2020-05-03 LAB — TROPONIN I (HIGH SENSITIVITY): Troponin I (High Sensitivity): 7 ng/L (ref <18)

## 2020-05-03 MED ORDER — LIDOCAINE VISCOUS HCL 2 % MT SOLN
15.0000 mL | Freq: Once | OROMUCOSAL | Status: AC
  Administered 2020-05-03: 15 mL via ORAL
  Filled 2020-05-03: qty 15

## 2020-05-03 MED ORDER — OMEPRAZOLE 20 MG PO CPDR: 20.0000 mg | DELAYED_RELEASE_CAPSULE | Freq: Every day | ORAL | 1 refills | Status: DC

## 2020-05-03 MED ORDER — ALUM & MAG HYDROXIDE-SIMETH 200-200-20 MG/5ML PO SUSP
15.0000 mL | Freq: Once | ORAL | Status: AC
  Administered 2020-05-03: 15 mL via ORAL
  Filled 2020-05-03: qty 30

## 2020-05-03 NOTE — ED Provider Notes (Signed)
[facility] Emergency Department Provider Note   ____________________________________________   Event Date/Time   First MD Initiated Contact with Patient 05/03/20 [phone]     (approximate)  I have reviewed the triage vital signs and the nursing notes.   HISTORY  Chief Complaint Chest Pain    HPI Dwayne Macdonald is a 40 y.o. male with no significant past medical history who presents to the ED complaining of chest pain.  Patient reports intermittent discomfort in the center of his chest over the past 2 days.  He describes it as a "swimmy feeling" that seems to be worse when he goes to lie flat.  He states it will sometimes feel like something is bubbling up into his chest, but he denies any nausea or vomiting.  He also denies any fevers, cough, or shortness of breath.  He was seen in the ED for similar symptoms last night, but states symptoms have persisted since then.        History reviewed. No pertinent past medical history.  There are no problems to display for this patient.   History reviewed. No pertinent surgical history.  Prior to Admission medications   Medication Sig Start Date End Date Taking? Authorizing Provider  omeprazole (PRILOSEC) 20 MG capsule Take 1 capsule (20 mg total) by mouth daily. 05/03/20 05/03/21 Yes Chesley Noon, MD  amoxicillin (AMOXIL) 500 MG capsule Take 1 capsule (500 mg total) by mouth 3 (three) times daily. 08/28/14   Christella Scheuermann, PA-C  ibuprofen (ADVIL) 600 MG tablet Take 1 tablet (600 mg total) by mouth every 6 (six) hours as needed for up to 7 days. 04/28/20 05/05/20  Concha Se, MD    Allergies Patient has no known allergies.  No family history on file.  Social History Social History   Tobacco Use  . Smoking status: Never Smoker  . Smokeless tobacco: Never Used  Substance Use Topics  . Alcohol use: No  . Drug use: Never    Review of Systems  Constitutional: No fever/chills Eyes: No visual  changes. ENT: No sore throat. Cardiovascular: Positive for chest pain. Respiratory: Denies shortness of breath. Gastrointestinal: No abdominal pain.  No nausea, no vomiting.  No diarrhea.  No constipation. Genitourinary: Negative for dysuria. Musculoskeletal: Negative for back pain. Skin: Negative for rash. Neurological: Negative for headaches, focal weakness or numbness.  ____________________________________________   PHYSICAL EXAM:  VITAL SIGNS: ED Triage Vitals  Enc Vitals Group     BP 05/03/20 0416 (!) 151/94     Pulse Rate 05/03/20 0416 80     Resp 05/03/20 0416 18     Temp 05/03/20 0416 98.4 F (36.9 C)     Temp Source 05/03/20 0416 Oral     SpO2 05/03/20 0416 97 %     Weight 05/03/20 0417 229 lb 4.5 oz (104 kg)     Height 05/03/20 0417 6' (1.829 m)     Head Circumference --      Peak Flow --      Pain Score 05/03/20 0417 6     Pain Loc --      Pain Edu? --      Excl. in GC? --     Constitutional: Alert and oriented. Eyes: Conjunctivae are normal. Head: Atraumatic. Nose: No congestion/rhinnorhea. Mouth/Throat: Mucous membranes are moist. Neck: Normal ROM Cardiovascular: Normal rate, regular rhythm. Grossly normal heart sounds. Respiratory: Normal respiratory effort.  No retractions. Lungs CTAB. Gastrointestinal: Soft and nontender. No distention. Genitourinary: deferred  Musculoskeletal: No lower extremity tenderness nor edema. Neurologic:  Normal speech and language. No gross focal neurologic deficits are appreciated. Skin:  Skin is warm, dry and intact. No rash noted. Psychiatric: Mood and affect are normal. Speech and behavior are normal.  ____________________________________________   LABS (all labs ordered are listed, but only abnormal results are displayed)  Labs Reviewed  BASIC METABOLIC PANEL - Abnormal; Notable for the following components:      Result Value   Potassium 3.3 (*)    All other components within normal limits  CBC - Abnormal;  Notable for the following components:   WBC 3.6 (*)    Hemoglobin 12.7 (*)    HCT 36.8 (*)    All other components within normal limits  TROPONIN I (HIGH SENSITIVITY)   ____________________________________________  EKG  ED ECG REPORT I, Chesley Noon, the attending physician, personally viewed and interpreted this ECG.   Date: 05/03/2020  EKG Time: 4:19  Rate: 78  Rhythm: normal sinus rhythm  Axis: Normal  Intervals:none  ST&T Change: None   PROCEDURES  Procedure(s) performed (including Critical Care):  Procedures   ____________________________________________   INITIAL IMPRESSION / ASSESSMENT AND PLAN / ED COURSE       40 year old male with no significant past medical history presents to the ED complaining of intermittent chest pain for the past 2 days that he describes as a "swimmy feeling."  Symptoms sound consistent with GERD given it is worse when he lies flat and will occasionally feel like a bubbling up into his chest.  EKG shows no evidence of arrhythmia or ischemia and troponin is negative, I doubt ACS.  Chest x-ray reviewed by me and shows no infiltrate, edema, or effusion.  Patient feels better following GI cocktail and is appropriate for discharge home with PCP follow-up.  We will start him on omeprazole and he was counseled to return to the ED for new or worsening symptoms, patient agrees with plan.      ____________________________________________   FINAL CLINICAL IMPRESSION(S) / ED DIAGNOSES  Final diagnoses:  Nonspecific chest pain  Gastroesophageal reflux disease, unspecified whether esophagitis present     ED Discharge Orders         Ordered    omeprazole (PRILOSEC) 20 MG capsule  Daily        05/03/20 0648           Note:  This document was prepared using Dragon voice recognition software and may include unintentional dictation errors.   Chesley Noon, MD 05/03/20 [phone]

## 2020-05-03 NOTE — ED Triage Notes (Signed)
BIB [facility] due to chest pain X 2 days. Pt seen for same 05/01/20. Reports pain as a "swimmy feeling" to center and left side of chest. No SOB Pt alert and oriented X4, cooperative, RR even and unlabored, color WNL. Pt in NAD. CBG 103 with EMS.

## 2020-05-04 ENCOUNTER — Other Ambulatory Visit: Payer: Self-pay

## 2020-05-04 ENCOUNTER — Emergency Department
Admission: EM | Admit: 2020-05-04 | Discharge: 2020-05-04 | Disposition: A | Discharge status: Home / Self Care | Payer: Medicare Other | Attending: Emergency Medicine | Admitting: Emergency Medicine

## 2020-05-04 ENCOUNTER — Encounter: Payer: Self-pay | Admitting: Emergency Medicine

## 2020-05-04 ENCOUNTER — Ambulatory Visit
Admission: EM | Admit: 2020-05-04 | Discharge: 2020-05-04 | Disposition: A | Discharge status: Home / Self Care | Payer: Medicare Other | Attending: Internal Medicine | Admitting: Internal Medicine

## 2020-05-04 ENCOUNTER — Emergency Department
Admission: EM | Admit: 2020-05-04 | Discharge: 2020-05-05 | Disposition: A | Discharge status: Home / Self Care | Payer: Medicare Other | Source: Home / Self Care | Attending: Emergency Medicine | Admitting: Emergency Medicine

## 2020-05-04 DIAGNOSIS — K219 Gastro-esophageal reflux disease without esophagitis: Secondary | ICD-10-CM

## 2020-05-04 DIAGNOSIS — R0789 Other chest pain: Secondary | ICD-10-CM | POA: Diagnosis present

## 2020-05-04 DIAGNOSIS — K2100 Gastro-esophageal reflux disease with esophagitis, without bleeding: Secondary | ICD-10-CM

## 2020-05-04 LAB — URINALYSIS, COMPLETE (UACMP) WITH MICROSCOPIC
Bacteria, UA: NONE SEEN
Bilirubin Urine: NEGATIVE
Glucose, UA: NEGATIVE mg/dL
Hgb urine dipstick: NEGATIVE
Ketones, ur: 20 mg/dL — AB
Leukocytes,Ua: NEGATIVE
Nitrite: NEGATIVE
Protein, ur: NEGATIVE mg/dL
Specific Gravity, Urine: 1.01 (ref 1.005–1.030)
pH: 6 (ref 5.0–8.0)

## 2020-05-04 LAB — HEPATIC FUNCTION PANEL
ALT: 23 U/L (ref 0–44)
AST: 22 U/L (ref 15–41)
Albumin: 4.5 g/dL (ref 3.5–5.0)
Alkaline Phosphatase: 62 U/L (ref 38–126)
Bilirubin, Direct: 0.1 mg/dL (ref 0.0–0.2)
Indirect Bilirubin: 0.9 mg/dL (ref 0.3–0.9)
Total Bilirubin: 1 mg/dL (ref 0.3–1.2)
Total Protein: 7.1 g/dL (ref 6.5–8.1)

## 2020-05-04 LAB — COMPREHENSIVE METABOLIC PANEL
ALT: 24 U/L (ref 0–44)
AST: 28 U/L (ref 15–41)
Albumin: 4.7 g/dL (ref 3.5–5.0)
Alkaline Phosphatase: 66 U/L (ref 38–126)
Anion gap: 9 (ref 5–15)
BUN: 12 mg/dL (ref 6–20)
CO2: 23 mmol/L (ref 22–32)
Calcium: 9 mg/dL (ref 8.9–10.3)
Chloride: 104 mmol/L (ref 98–111)
Creatinine, Ser: 0.89 mg/dL (ref 0.61–1.24)
GFR, Estimated: >60 mL/min (ref >60)
Glucose, Bld: 97 mg/dL (ref 70–99)
Potassium: 3 mmol/L — ABNORMAL LOW (ref 3.5–5.1)
Sodium: 136 mmol/L (ref 135–145)
Total Bilirubin: 1.4 mg/dL — ABNORMAL HIGH (ref 0.3–1.2)
Total Protein: 7.6 g/dL (ref 6.5–8.1)

## 2020-05-04 LAB — D-DIMER, QUANTITATIVE: D-Dimer, Quant: 0.29 ug/mL-FEU (ref 0.00–0.50)

## 2020-05-04 LAB — CBC
HCT: 35.5 % — ABNORMAL LOW (ref 39.0–52.0)
Hemoglobin: 12.1 g/dL — ABNORMAL LOW (ref 13.0–17.0)
MCH: 29.3 pg (ref 26.0–34.0)
MCHC: 34.1 g/dL (ref 30.0–36.0)
MCV: 86 fL (ref 80.0–100.0)
Platelets: 218 10*3/uL (ref 150–400)
RBC: 4.13 MIL/uL — ABNORMAL LOW (ref 4.22–5.81)
RDW: 12 % (ref 11.5–15.5)
WBC: 5.4 10*3/uL (ref 4.0–10.5)
nRBC: 0 % (ref 0.0–0.2)

## 2020-05-04 LAB — LIPASE, BLOOD
Lipase: 29 U/L (ref 11–51)
Lipase: 28 U/L (ref 11–51)

## 2020-05-04 LAB — TROPONIN I (HIGH SENSITIVITY): Troponin I (High Sensitivity): 9 ng/L (ref <18)

## 2020-05-04 MED ORDER — ALUM & MAG HYDROXIDE-SIMETH 200-200-20 MG/5ML PO SUSP
30.0000 mL | Freq: Once | ORAL | Status: AC
  Administered 2020-05-04: 30 mL via ORAL

## 2020-05-04 MED ORDER — LIDOCAINE VISCOUS HCL 2 % MT SOLN
15.0000 mL | Freq: Once | OROMUCOSAL | Status: AC
  Administered 2020-05-04: 15 mL via ORAL

## 2020-05-04 MED ORDER — KETOROLAC TROMETHAMINE 30 MG/ML IJ SOLN
30.0000 mg | Freq: Once | INTRAMUSCULAR | Status: AC
  Administered 2020-05-04: 30 mg via INTRAVENOUS
  Filled 2020-05-04: qty 1

## 2020-05-04 NOTE — ED Provider Notes (Signed)
[facility] Emergency Department Provider Note  ____________________________________________   Event Date/Time   First MD Initiated Contact with Patient 05/04/20 [phone]     (approximate)  I have reviewed the triage vital signs and the nursing notes.   HISTORY  Chief Complaint Chest Pain    HPI Dwayne Macdonald is a 40 y.o. male with history of disability per PCP notes who presents to the emergency department for the fourth time in 1 week for complaints of central chest pain without radiation.  He describes it as a "swirling" inside of his chest and feels like something is "eating away" in his chest.  He states he does get some tightness and pressure mostly with sitting still.  He denies that his pain is exertional, pleuritic or related to food.  He denies any calf tenderness or calf swelling.  No associated shortness of breath, nausea, vomiting, fevers or cough.  States he became concerned tonight and called 911 because he started having lower back pain.  No injury to the back.  No numbness, weakness, bowel or bladder incontinence, urinary retention.  He is also asking for an abdominal x-ray because he states he feels "swirling" in his abdomen as well.  No diarrhea.  No dysuria, hematuria, penile discharge.  Patient lives at home with his mother.        History reviewed. No pertinent past medical history.  There are no problems to display for this patient.   History reviewed. No pertinent surgical history.  Prior to Admission medications   Medication Sig Start Date End Date Taking? Authorizing Provider  amoxicillin (AMOXIL) 500 MG capsule Take 1 capsule (500 mg total) by mouth 3 (three) times daily. 08/28/14   Christella Scheuermann, PA-C  ibuprofen (ADVIL) 600 MG tablet Take 1 tablet (600 mg total) by mouth every 6 (six) hours as needed for up to 7 days. 04/28/20 05/05/20  Concha Se, MD  omeprazole (PRILOSEC) 20 MG capsule Take 1 capsule (20 mg total) by mouth  daily. 05/03/20 05/03/21  Chesley Noon, MD    Allergies Patient has no known allergies.  History reviewed. No pertinent family history.  Social History Social History   Tobacco Use  . Smoking status: Never Smoker  . Smokeless tobacco: Never Used  Substance Use Topics  . Alcohol use: No  . Drug use: Never    Review of Systems Constitutional: No fever. Eyes: No visual changes. ENT: No sore throat. Cardiovascular: + chest pain. Respiratory: Denies shortness of breath. Gastrointestinal: No nausea, vomiting, diarrhea. Genitourinary: Negative for dysuria. Musculoskeletal: Negative for back pain. Skin: Negative for rash. Neurological: Negative for focal weakness or numbness.  ____________________________________________   PHYSICAL EXAM:  VITAL SIGNS: ED Triage Vitals  Enc Vitals Group     BP 05/04/20 0246 (!) 149/85     Pulse Rate 05/04/20 0246 80     Resp 05/04/20 0246 18     Temp 05/04/20 0246 98.1 F (36.7 C)     Temp src --      SpO2 05/04/20 0246 96 %     Weight 05/04/20 0247 230 lb (104.3 kg)     Height 05/04/20 0247 6' (1.829 m)     Head Circumference --      Peak Flow --      Pain Score 05/04/20 0247 5     Pain Loc --      Pain Edu? --      Excl. in GC? --    CONSTITUTIONAL:  Alert and oriented and responds appropriately to questions. Well-appearing; well-nourished HEAD: Normocephalic EYES: Conjunctivae clear, pupils appear equal, EOM appear intact ENT: normal nose; moist mucous membranes NECK: Supple, normal ROM CARD: RRR; S1 and S2 appreciated; no murmurs, no clicks, no rubs, no gallops RESP: Normal chest excursion without splinting or tachypnea; breath sounds clear and equal bilaterally; no wheezes, no rhonchi, no rales, no hypoxia or respiratory distress, speaking full sentences ABD/GI: Normal bowel sounds; non-distended; soft, non-tender, no rebound, no guarding, no peritoneal signs, no hepatosplenomegaly BACK: The back appears normal, no midline  spinal tenderness or step-off or deformity EXT: Normal ROM in all joints; no deformity noted, no edema; no cyanosis, no calf tenderness or calf swelling SKIN: Normal color for age and race; warm; no rash on exposed skin NEURO: Moves all extremities equally, normal speech, no facial asymmetry, normal sensation diffusely without saddle anesthesia, no hyperreflexia PSYCH: The patient's mood and manner are appropriate.  ____________________________________________   LABS (all labs ordered are listed, but only abnormal results are displayed)  Labs Reviewed  D-DIMER, QUANTITATIVE  HEPATIC FUNCTION PANEL  LIPASE, BLOOD  TROPONIN I (HIGH SENSITIVITY)   ____________________________________________  EKG   Date: 05/04/2020 3:50 AM  Rate: 77  Rhythm: normal sinus rhythm  QRS Axis: normal  Intervals: normal  ST/T Wave abnormalities: normal  Conduction Disutrbances: none  Narrative Interpretation: unremarkable     ____________________________________________  RADIOLOGY I, Oluwatobi Visser, personally viewed and evaluated these images (plain radiographs) as part of my medical decision making, as well as reviewing the written report by the radiologist.  ED MD interpretation: Chest x-ray obtained yesterday shows no acute abnormality.  Official radiology report(s): DG Chest 2 View  Result Date: 05/03/2020 CLINICAL DATA:  Chest pain EXAM: CHEST - 2 VIEW COMPARISON:  05/01/2020 FINDINGS: The heart size and mediastinal contours are within normal limits. Both lungs are clear. The visualized skeletal structures are unremarkable. IMPRESSION: No active cardiopulmonary disease. Electronically Signed   By: Helyn Numbers MD   On: 05/03/2020 04:39    ____________________________________________   PROCEDURES  Procedure(s) performed (including Critical Care):  Procedures   ____________________________________________   INITIAL IMPRESSION / ASSESSMENT AND PLAN / ED COURSE  As part of my  medical decision making, I reviewed the following data within the electronic MEDICAL RECORD NUMBER Nursing notes reviewed and incorporated, Labs reviewed,  EKG interpreted NSR, Old EKG reviewed, Old chart reviewed, Radiograph reviewed  and Notes from prior ED visits         Patient here with very atypical chest pain that he states is also in his abdomen and back.  This is his fourth visit to the emergency department for the same.  He has had multiple negative troponins and 3 clear chest x-rays.  His labs were unremarkable other than mild leukopenia but he denies any infectious symptoms.  He complains now of abdominal discomfort and feels like there is "swirling" in his chest, abdomen and back.  Will repeat troponin today and obtain LFTs, lipase given complaints of abdominal discomfort.  I have low suspicion that this is dissection.  He is extremely well-appearing and appears comfortable at this time.  No risk factors for PE but given that this is his fourth visit to the emergency department for the same, will obtain D-dimer to rule out PE.  He is requesting abdominal x-ray.  We have discussed indications for abdominal imaging.  He is nontender and nondistended.  I do not feel this needs to be done at this time  and he is comfortable with this plan.  I have low suspicion for pancreatitis, cholecystitis, cholangitis, choledocholithiasis, colitis, diverticulitis, appendicitis, bowel obstruction.  He states he has follow-up with his primary care doctor in 2 days.  ED PROGRESS  Patient's troponin is normal.  D-dimer negative.  Normal LFTs and lipase.  Patient reports feeling better after Toradol.  I feel he is safe to be discharged home.  Recommended close follow-up with his PCP.  Patient comfortable with this plan.  At this time, I do not feel there is any life-threatening condition present. I have reviewed, interpreted and discussed all results (EKG, imaging, lab, urine as appropriate) and exam findings with  patient/family. I have reviewed nursing notes and appropriate previous records.  I feel the patient is safe to be discharged home without further emergent workup and can continue workup as an outpatient as needed. Discussed usual and customary return precautions. Patient/family verbalize understanding and are comfortable with this plan.  Outpatient follow-up has been provided as needed. All questions have been answered.'  ____________________________________________   FINAL CLINICAL IMPRESSION(S) / ED DIAGNOSES  Final diagnoses:  Atypical chest pain     ED Discharge Orders    None      *Please note:  Dwayne Macdonald was evaluated in Emergency Department on 05/04/2020 for the symptoms described in the history of present illness. He was evaluated in the context of the global COVID-19 pandemic, which necessitated consideration that the patient might be at risk for infection with the SARS-CoV-2 virus that causes COVID-19. Institutional protocols and algorithms that pertain to the evaluation of patients at risk for COVID-19 are in a state of rapid change based on information released by regulatory bodies including the CDC and federal and state organizations. These policies and algorithms were followed during the patient's care in the ED.  Some ED evaluations and interventions may be delayed as a result of limited staffing during and the pandemic.*   Note:  This document was prepared using Dragon voice recognition software and may include unintentional dictation errors.   Tramaine Snell, Layla Maw, DO 05/04/20 [phone]

## 2020-05-04 NOTE — ED Notes (Signed)
Patient verbalizes understanding of discharge instructions. Opportunity for questioning and answers were provided. Armband removed by staff, pt discharged from ED. Ambulated out to lobby  

## 2020-05-04 NOTE — ED Triage Notes (Addendum)
Pt in with oc abd pain states was here recently for the same but states "I want xray of my abdominal system". States last BM 2 days, no vomiting, no diarrhea. States was dx with acid reflux.

## 2020-05-04 NOTE — ED Triage Notes (Signed)
Pt to ED via EMS from home Pt c/o of chest pain located in the center of chest. Pt states that pain moves toward his lower back. Pt states pain is intermittent for the past few days. Pt is a/o x4.

## 2020-05-04 NOTE — ED Triage Notes (Signed)
Patient c/o something "running around in his chest" that started a few days ago. He was seen in the ER earlier this morning for the same symptoms.

## 2020-05-04 NOTE — Discharge Instructions (Signed)
Please take omeprazole as prescribed.  If symptoms worsen please reach out to your PCP for further recommendations.

## 2020-05-05 ENCOUNTER — Emergency Department: Payer: Medicare Other

## 2020-05-05 DIAGNOSIS — R0789 Other chest pain: Secondary | ICD-10-CM | POA: Diagnosis not present

## 2020-05-05 MED ORDER — ALUMINUM-MAGNESIUM-SIMETHICONE 200-200-20 MG/5ML PO SUSP: 30.0000 mL | Freq: Three times a day (TID) | ORAL | 0 refills | Status: AC

## 2020-05-05 MED ORDER — METOCLOPRAMIDE HCL 10 MG PO TABS: 10.0000 mg | ORAL_TABLET | Freq: Four times a day (QID) | ORAL | 0 refills | Status: DC | PRN

## 2020-05-05 MED ORDER — ALUM & MAG HYDROXIDE-SIMETH 200-200-20 MG/5ML PO SUSP
30.0000 mL | Freq: Once | ORAL | Status: AC
  Administered 2020-05-05: 30 mL via ORAL
  Filled 2020-05-05: qty 30

## 2020-05-05 NOTE — ED Notes (Signed)
Pt called out to nurse at nurses station. Pt stated he was continuing to have burning sensation through his chest. Pt reassured that cause likely from acid reflux.   Pt provided with ginger ale and saltine crackers at this time.

## 2020-05-05 NOTE — ED Provider Notes (Signed)
[facility] Emergency Department Provider Note  ____________________________________________  Time seen: Approximately 1:55 AM  I have reviewed the triage vital signs and the nursing notes.   HISTORY  Chief Complaint Abdominal Pain    HPI Dwayne Macdonald is a 40 y.o. male with no significant past medical history who comes ED complaining of epigastric pain radiating up into the chest and throat, worse lying down, better sitting upright.  Was seen here yesterday and diagnosed with acid reflux, given omeprazole which he started taking.  No exertional symptoms, not pleuritic.  Pain is intermittent lasting a few seconds at a time.      Past medical history noncontributory   There are no problems to display for this patient.    Past surgical history negative   Prior to Admission medications   Medication Sig Start Date End Date Taking? Authorizing Provider  aluminum-magnesium hydroxide-simethicone (MAALOX) 200-200-20 MG/5ML SUSP Take 30 mLs by mouth 4 (four) times daily -  before meals and at bedtime. 05/05/20  Yes Sharman Cheek, MD  metoCLOPramide (REGLAN) 10 MG tablet Take 1 tablet (10 mg total) by mouth every 6 (six) hours as needed. 05/05/20  Yes Sharman Cheek, MD  omeprazole (PRILOSEC) 20 MG capsule Take 1 capsule (20 mg total) by mouth daily. 05/03/20 05/03/21  Chesley Noon, MD     Allergies Patient has no known allergies.   Family History  Problem Relation Age of Onset  . Healthy Mother   . Healthy Father     Social History Social History   Tobacco Use  . Smoking status: Never Smoker  . Smokeless tobacco: Never Used  Substance Use Topics  . Alcohol use: No  . Drug use: Never    Review of Systems  Constitutional:   No fever or chills.  ENT:   No sore throat. No rhinorrhea. Cardiovascular:   No chest pain or syncope. Respiratory:   No dyspnea or cough. Gastrointestinal:   Positive as above for abdominal pain without vomiting  and diarrhea.  Musculoskeletal:   Negative for focal pain or swelling All other systems reviewed and are negative except as documented above in ROS and HPI.  ____________________________________________   PHYSICAL EXAM:  VITAL SIGNS: ED Triage Vitals [05/04/20 2253]  Enc Vitals Group     BP (!) 157/95     Pulse Rate 92     Resp 20     Temp 98.7 F (37.1 C)     Temp Source Oral     SpO2 96 %     Weight 230 lb (104.3 kg)     Height 5\' 9"  (1.753 m)     Head Circumference      Peak Flow      Pain Score 5     Pain Loc      Pain Edu?      Excl. in GC?     Vital signs reviewed, nursing assessments reviewed.   Constitutional:   Alert and oriented. Non-toxic appearance. Eyes:   Conjunctivae are normal. EOMI. PERRL. ENT      Head:   Normocephalic and atraumatic.      Nose:   Wearing a mask.      Mouth/Throat:   Wearing a mask.      Neck:   No meningismus. Full ROM. Hematological/Lymphatic/Immunilogical:   No cervical lymphadenopathy. Cardiovascular:   RRR. Symmetric bilateral radial and DP pulses.  No murmurs. Cap refill less than 2 seconds. Respiratory:   Normal respiratory effort without tachypnea/retractions. Breath  sounds are clear and equal bilaterally. No wheezes/rales/rhonchi. Gastrointestinal:   Soft with mild left upper quadrant tenderness. Non distended. There is no CVA tenderness.  No rebound, rigidity, or guarding. Genitourinary:   deferred Musculoskeletal:   Normal range of motion in all extremities. No joint effusions.  No lower extremity tenderness.  No edema. Neurologic:   Normal speech and language.  Motor grossly intact. No acute focal neurologic deficits are appreciated.  Skin:    Skin is warm, dry and intact. No rash noted.  No petechiae, purpura, or bullae.  ____________________________________________    LABS (pertinent positives/negatives) (all labs ordered are listed, but only abnormal results are displayed) Labs Reviewed  CBC - Abnormal; Notable  for the following components:      Result Value   RBC 4.13 (*)    Hemoglobin 12.1 (*)    HCT 35.5 (*)    All other components within normal limits  COMPREHENSIVE METABOLIC PANEL - Abnormal; Notable for the following components:   Potassium 3.0 (*)    Total Bilirubin 1.4 (*)    All other components within normal limits  URINALYSIS, COMPLETE (UACMP) WITH MICROSCOPIC - Abnormal; Notable for the following components:   Color, Urine YELLOW (*)    APPearance CLEAR (*)    Ketones, ur 20 (*)    All other components within normal limits  LIPASE, BLOOD   ____________________________________________   EKG    ____________________________________________    RADIOLOGY  DG Abdomen Acute W/Chest  Result Date: 05/05/2020 CLINICAL DATA:  Epigastric abdominal pain EXAM: DG ABDOMEN ACUTE WITH 1 VIEW CHEST COMPARISON:  None. FINDINGS: There is no evidence of dilated bowel loops or free intraperitoneal air. No radiopaque calculi or other significant radiographic abnormality is seen. Heart size and mediastinal contours are within normal limits. Both lungs are clear. IMPRESSION: Negative abdominal radiographs.  No acute cardiopulmonary disease. Electronically Signed   By: Helyn Numbers MD   On: 05/05/2020 01:09    ____________________________________________   PROCEDURES Procedures  ____________________________________________    CLINICAL IMPRESSION / ASSESSMENT AND PLAN / ED COURSE  Medications ordered in the ED: Medications  alum & mag hydroxide-simeth (MAALOX/MYLANTA) 200-200-20 MG/5ML suspension 30 mL (30 mLs Oral Given 05/05/20 0052)    Pertinent labs & imaging results that were available during my care of the patient were reviewed by me and considered in my medical decision making (see chart for details).  Dwayne Macdonald was evaluated in Emergency Department on 05/05/2020 for the symptoms described in the history of present illness. He was evaluated in the context of the global  COVID-19 pandemic, which necessitated consideration that the patient might be at risk for infection with the SARS-CoV-2 virus that causes COVID-19. Institutional protocols and algorithms that pertain to the evaluation of patients at risk for COVID-19 are in a state of rapid change based on information released by regulatory bodies including the CDC and federal and state organizations. These policies and algorithms were followed during the patient's care in the ED.   Patient presents with epigastric pain typical of GERD.  Vital signs are unremarkable.  He is nontoxic and well-appearing, exam is consistent with gastritis as well.  Labs unremarkable, no evidence of pancreatitis or biliary disease.  X-rays also normal without signs of obstruction.  Stable for discharge home.  Continue omeprazole, add on Reglan and Maalox.      ____________________________________________   FINAL CLINICAL IMPRESSION(S) / ED DIAGNOSES    Final diagnoses:  Gastroesophageal reflux disease without esophagitis  ED Discharge Orders         Ordered    metoCLOPramide (REGLAN) 10 MG tablet  Every 6 hours PRN        05/05/20 0154    aluminum-magnesium hydroxide-simethicone (MAALOX) 200-200-20 MG/5ML SUSP  3 times daily before meals & bedtime        05/05/20 0154          Portions of this note were generated with dragon dictation software. Dictation errors may occur despite best attempts at proofreading.   Sharman Cheek, MD 05/05/20 0157

## 2020-05-05 NOTE — Discharge Instructions (Signed)
Your lab tests and xrays today were all normal.

## 2020-05-06 ENCOUNTER — Other Ambulatory Visit: Payer: Self-pay

## 2020-05-06 ENCOUNTER — Emergency Department
Admission: EM | Admit: 2020-05-06 | Discharge: 2020-05-06 | Disposition: A | Discharge status: Home / Self Care | Payer: Medicare Other | Attending: Student in an Organized Health Care Education/Training Program | Admitting: Student in an Organized Health Care Education/Training Program

## 2020-05-06 ENCOUNTER — Emergency Department: Payer: Medicare Other

## 2020-05-06 DIAGNOSIS — R1013 Epigastric pain: Secondary | ICD-10-CM | POA: Insufficient documentation

## 2020-05-06 DIAGNOSIS — R110 Nausea: Secondary | ICD-10-CM | POA: Insufficient documentation

## 2020-05-06 DIAGNOSIS — R251 Tremor, unspecified: Secondary | ICD-10-CM | POA: Insufficient documentation

## 2020-05-06 LAB — CBC WITH DIFFERENTIAL/PLATELET
Abs Immature Granulocytes: 0.01 10*3/uL (ref 0.00–0.07)
Basophils Absolute: 0 10*3/uL (ref 0.0–0.1)
Basophils Relative: 1 %
Eosinophils Absolute: 0 10*3/uL (ref 0.0–0.5)
Eosinophils Relative: 0 %
HCT: 37.3 % — ABNORMAL LOW (ref 39.0–52.0)
Hemoglobin: 12.7 g/dL — ABNORMAL LOW (ref 13.0–17.0)
Immature Granulocytes: 0 %
Lymphocytes Relative: 25 %
Lymphs Abs: 1.6 10*3/uL (ref 0.7–4.0)
MCH: 29.5 pg (ref 26.0–34.0)
MCHC: 34 g/dL (ref 30.0–36.0)
MCV: 86.5 fL (ref 80.0–100.0)
Monocytes Absolute: 0.6 10*3/uL (ref 0.1–1.0)
Monocytes Relative: 9 %
Neutro Abs: 4.2 10*3/uL (ref 1.7–7.7)
Neutrophils Relative %: 65 %
Platelets: 217 10*3/uL (ref 150–400)
RBC: 4.31 MIL/uL (ref 4.22–5.81)
RDW: 11.9 % (ref 11.5–15.5)
WBC: 6.4 10*3/uL (ref 4.0–10.5)
nRBC: 0 % (ref 0.0–0.2)

## 2020-05-06 LAB — COMPREHENSIVE METABOLIC PANEL
ALT: 24 U/L (ref 0–44)
AST: 37 U/L (ref 15–41)
Albumin: 4.8 g/dL (ref 3.5–5.0)
Alkaline Phosphatase: 63 U/L (ref 38–126)
Anion gap: 8 (ref 5–15)
BUN: 9 mg/dL (ref 6–20)
CO2: 26 mmol/L (ref 22–32)
Calcium: 9.1 mg/dL (ref 8.9–10.3)
Chloride: 104 mmol/L (ref 98–111)
Creatinine, Ser: 0.97 mg/dL (ref 0.61–1.24)
GFR, Estimated: >60 mL/min (ref >60)
Glucose, Bld: 87 mg/dL (ref 70–99)
Potassium: 3.3 mmol/L — ABNORMAL LOW (ref 3.5–5.1)
Sodium: 138 mmol/L (ref 135–145)
Total Bilirubin: 1.4 mg/dL — ABNORMAL HIGH (ref 0.3–1.2)
Total Protein: 7.4 g/dL (ref 6.5–8.1)

## 2020-05-06 LAB — LIPASE, BLOOD: Lipase: 31 U/L (ref 11–51)

## 2020-05-06 MED ORDER — IOHEXOL 300 MG/ML  SOLN
125.0000 mL | Freq: Once | INTRAMUSCULAR | Status: AC | PRN
  Administered 2020-05-06: 125 mL via INTRAVENOUS

## 2020-05-06 MED ORDER — LORAZEPAM 1 MG PO TABS
1.0000 mg | ORAL_TABLET | Freq: Once | ORAL | Status: AC
  Administered 2020-05-06: 1 mg via ORAL
  Filled 2020-05-06: qty 1

## 2020-05-06 NOTE — Discharge Instructions (Addendum)

## 2020-05-06 NOTE — ED Provider Notes (Signed)
[facility]    CSN: [id number] Arrival date & time: 05/04/20  1104      History   Chief Complaint Chief Complaint  Patient presents with  . Chest Pain    HPI Dwayne Macdonald is a 40 y.o. male comes to the urgent care with complaints of "bubbling" sensation in the lower part of his chest.  Symptoms started a few days ago and has been persistent.  Patient has been seen on multiple occasions in the emergency room for the same reason.  Troponins have been negative.  No cough or sputum production.  No worsening of his symptoms with food or hunger.  No vomiting.  No abdominal distention or diarrhea.  No change in bowel movements.  No known relieving factors.Marland Kitchen   HPI  History reviewed. No pertinent past medical history.  There are no problems to display for this patient.   History reviewed. No pertinent surgical history.     Home Medications    Prior to Admission medications   Medication Sig Start Date End Date Taking? Authorizing Provider  omeprazole (PRILOSEC) 20 MG capsule Take 1 capsule (20 mg total) by mouth daily. 05/03/20 05/03/21 Yes Chesley Noon, MD  aluminum-magnesium hydroxide-simethicone (MAALOX) 200-200-20 MG/5ML SUSP Take 30 mLs by mouth 4 (four) times daily -  before meals and at bedtime. 05/05/20   Sharman Cheek, MD  metoCLOPramide (REGLAN) 10 MG tablet Take 1 tablet (10 mg total) by mouth every 6 (six) hours as needed. 05/05/20   Sharman Cheek, MD    Family History Family History  Problem Relation Age of Onset  . Healthy Mother   . Healthy Father     Social History Social History   Tobacco Use  . Smoking status: Never Smoker  . Smokeless tobacco: Never Used  Substance Use Topics  . Alcohol use: No  . Drug use: Never     Allergies   Patient has no known allergies.   Review of Systems Review of Systems  Constitutional: Negative.   Respiratory: Negative.   Cardiovascular: Negative.   Gastrointestinal: Negative.      Physical  Exam Triage Vital Signs ED Triage Vitals  Enc Vitals Group     BP 05/04/20 1206 (!) 149/86     Pulse Rate 05/04/20 1206 84     Resp 05/04/20 1206 18     Temp 05/04/20 1206 98 F (36.7 C)     Temp Source 05/04/20 1206 Oral     SpO2 05/04/20 1206 100 %     Weight 05/04/20 1204 229 lb 15 oz (104.3 kg)     Height 05/04/20 1204 6' (1.829 m)     Head Circumference --      Peak Flow --      Pain Score 05/04/20 1204 0     Pain Loc --      Pain Edu? --      Excl. in GC? --    No data found.  Updated Vital Signs BP (!) 149/86 (BP Location: Right Arm)   Pulse 84   Temp 98 F (36.7 C) (Oral)   Resp 18   Ht 6' (1.829 m)   Wt 104.3 kg   SpO2 100%   BMI 31.19 kg/m   Visual Acuity Right Eye Distance:   Left Eye Distance:   Bilateral Distance:    Right Eye Near:   Left Eye Near:    Bilateral Near:     Physical Exam   UC Treatments / Results  Labs (  all labs ordered are listed, but only abnormal results are displayed) Labs Reviewed - No data to display  EKG   Radiology CT ABDOMEN PELVIS W CONTRAST  Result Date: 05/06/2020 CLINICAL DATA:  Diverticulitis, abdominal pain radiating to back common nausea and vomiting, multiple recent assessments for similar symptoms EXAM: CT ABDOMEN AND PELVIS WITH CONTRAST TECHNIQUE: Multidetector CT imaging of the abdomen and pelvis was performed using the standard protocol following bolus administration of intravenous contrast. CONTRAST:  OMNIPAQUE IOHEXOL 300 MG/ML  SOLN COMPARISON:  Acute abdominal series 05/05/2020 FINDINGS: Lower chest: Motion artifact in the lung bases. Lung bases are relatively clear aside from some minimal atelectatic change. Normal heart size. No pericardial effusion. Hepatobiliary: No worrisome focal liver lesions. Smooth liver surface contour. Normal hepatic attenuation. Layering attenuation within the otherwise normal gallbladder, could reflect biliary sludge or sand. No pericholecystic fluid or inflammation. No  biliary ductal dilatation or intraductal gallstones are visible. Pancreas: No pancreatic ductal dilatation or surrounding inflammatory changes. Spleen: Normal in size. No concerning splenic lesions. Adrenals/Urinary Tract: Normal adrenal glands. Kidneys are normally located with symmetric enhancementand excretion. Lobular contours of the bilateral kidneys may reflect persistent fetal lobulation or scarring. No suspicious renal lesion, urolithiasis or hydronephrosis. Urinary bladder is unremarkable for degree of distension. Stomach/Bowel: Distal esophagus, stomach and duodenum are unremarkable. Normal duodenal sweep across the midline abdomen to the ligament of Treitz. No small bowel thickening or dilatation. Some intramural fatty infiltration of the cecum and ascending colon is a nonspecific finding though can be seen in the setting of prior inflammatory bowel disease or a lipomatous body habitus. Normal air-filled appendix in the right lower quadrant. No evidence of obstruction. Few scattered colonic diverticula without acute focal diverticular inflammation to suggest an acute diverticulitis. Vascular/Lymphatic: Atherosclerotic calcifications within the abdominal aorta and branch vessels. No aneurysm or ectasia. No enlarged abdominopelvic lymph nodes. Reproductive: The prostate and seminal vesicles are unremarkable. Other: No abdominopelvic free air or fluid. Tiny fat containing umbilical hernia and fat containing inguinal hernias, left slightly greater than right. Musculoskeletal: No acute osseous abnormality or suspicious osseous lesion. IMPRESSION: 1. Layering attenuation within the otherwise normal gallbladder, could reflect biliary sludge or sand. No pericholecystic fluid or inflammation nor biliary ductal dilatation. 2. No other acute intra-abdominal process to provide cause for patient's symptoms. 3. Some intramural fatty infiltration of the cecum and ascending colon is a nonspecific finding though can be  seen in the setting of prior inflammatory bowel disease or a lipomatous body habitus. 4. Tiny fat containing umbilical hernia and fat containing inguinal hernias, left slightly greater than right. No acute inflammation at the sites. No bowel containing hernia. 5. Aortic Atherosclerosis (ICD10-I70.0). Electronically Signed   By: Kreg Shropshire M.D.   On: 05/06/2020 03:56   DG Abdomen Acute W/Chest  Result Date: 05/05/2020 CLINICAL DATA:  Epigastric abdominal pain EXAM: DG ABDOMEN ACUTE WITH 1 VIEW CHEST COMPARISON:  None. FINDINGS: There is no evidence of dilated bowel loops or free intraperitoneal air. No radiopaque calculi or other significant radiographic abnormality is seen. Heart size and mediastinal contours are within normal limits. Both lungs are clear. IMPRESSION: Negative abdominal radiographs.  No acute cardiopulmonary disease. Electronically Signed   By: Helyn Numbers MD   On: 05/05/2020 01:09    Procedures Procedures (including critical care time)  Medications Ordered in UC Medications  alum & mag hydroxide-simeth (MAALOX/MYLANTA) 200-200-20 MG/5ML suspension 30 mL (30 mLs Oral Given 05/04/20 1306)    And  lidocaine (XYLOCAINE) 2 %  viscous mouth solution 15 mL (15 mLs Oral Given 05/04/20 1306)    Initial Impression / Assessment and Plan / UC Course  I have reviewed the triage vital signs and the nursing notes.  Pertinent labs & imaging results that were available during my care of the patient were reviewed by me and considered in my medical decision making (see chart for details).     1.  Suspected gastroesophageal reflux disease: GI cocktail was given to the patient.  Patient had partial relief in his symptoms.  Patient encouraged to continue taking omeprazole as prescribed in the ED.  Patient verbalizes understanding. Return to ED precautions given. Final Clinical Impressions(s) / UC Diagnoses   Final diagnoses:  Gastroesophageal reflux disease with esophagitis, unspecified  whether hemorrhage     Discharge Instructions     Please take omeprazole as prescribed.  If symptoms worsen please reach out to your PCP for further recommendations.   ED Prescriptions    None     PDMP not reviewed this encounter.   Merrilee Jansky, MD 05/06/20 1213

## 2020-05-06 NOTE — ED Provider Notes (Signed)
[facility] Emergency Department Provider Note    Event Date/Time   First MD Initiated Contact with Patient 05/06/20 0126     (approximate)  I have reviewed the triage vital signs and the nursing notes.   HISTORY  Chief Complaint Abdominal Pain    HPI Dwayne Macdonald is a 40 y.o. male no significant past medical history presents to the ER with persistent worsening epigastric pain going through to his back.  Patient thinks that he is got severe acid reflux because he has been drinking dark sodas.  States he was feeling shaky as well as nauseated earlier today.  States he been taking omeprazole without much improvement.  Denies any shortness of breath.  No chest pain.  Does not drink alcohol.    History reviewed. No pertinent past medical history. Family History  Problem Relation Age of Onset  . Healthy Mother   . Healthy Father    History reviewed. No pertinent surgical history. There are no problems to display for this patient.     Prior to Admission medications   Medication Sig Start Date End Date Taking? Authorizing Provider  aluminum-magnesium hydroxide-simethicone (MAALOX) 200-200-20 MG/5ML SUSP Take 30 mLs by mouth 4 (four) times daily -  before meals and at bedtime. 05/05/20   Sharman Cheek, MD  metoCLOPramide (REGLAN) 10 MG tablet Take 1 tablet (10 mg total) by mouth every 6 (six) hours as needed. 05/05/20   Sharman Cheek, MD  omeprazole (PRILOSEC) 20 MG capsule Take 1 capsule (20 mg total) by mouth daily. 05/03/20 05/03/21  Chesley Noon, MD    Allergies Patient has no known allergies.    Social History Social History   Tobacco Use  . Smoking status: Never Smoker  . Smokeless tobacco: Never Used  Substance Use Topics  . Alcohol use: No  . Drug use: Never    Review of Systems Patient denies headaches, rhinorrhea, blurry vision, numbness, shortness of breath, chest pain, edema, cough, abdominal pain, nausea, vomiting,  diarrhea, dysuria, fevers, rashes or hallucinations unless otherwise stated above in HPI. ____________________________________________   PHYSICAL EXAM:  VITAL SIGNS: Vitals:   05/06/20 0330 05/06/20 0400  BP: (!) 151/84 (!) 164/101  Pulse: 78 72  Resp: 14 15  Temp:    SpO2: 93% 95%    Constitutional: Alert and oriented.  Eyes: Conjunctivae are normal.  Head: Atraumatic. Nose: No congestion/rhinnorhea. Mouth/Throat: Mucous membranes are moist.   Neck: No stridor. Painless ROM.  Cardiovascular: Normal rate, regular rhythm. Grossly normal heart sounds.  Good peripheral circulation. Respiratory: Normal respiratory effort.  No retractions. Lungs CTAB. Gastrointestinal: Soft and nontender. No distention. No abdominal bruits. No CVA tenderness. Genitourinary:  Musculoskeletal: No lower extremity tenderness nor edema.  No joint effusions. Neurologic:  Normal speech and language. No gross focal neurologic deficits are appreciated. No facial droop Skin:  Skin is warm, dry and intact. No rash noted. Psychiatric: Mood and affect are normal. Speech and behavior are normal.  ____________________________________________   LABS (all labs ordered are listed, but only abnormal results are displayed)  Results for orders placed or performed during the hospital encounter of 05/06/20 (from the past 24 hour(s))  CBC with Differential     Status: Abnormal   Collection Time: 05/06/20  2:11 AM  Result Value Ref Range   WBC 6.4 4.0 - 10.5 K/uL   RBC 4.31 4.22 - 5.81 MIL/uL   Hemoglobin 12.7 (L) 13.0 - 17.0 g/dL   HCT 41.9 (L) 37.9 - 02.4 %  MCV 86.5 80.0 - 100.0 fL   MCH 29.5 26.0 - 34.0 pg   MCHC 34.0 30.0 - 36.0 g/dL   RDW 77.8 24.2 - 35.3 %   Platelets 217 150 - 400 K/uL   nRBC 0.0 0.0 - 0.2 %   Neutrophils Relative % 65 %   Neutro Abs 4.2 1.7 - 7.7 K/uL   Lymphocytes Relative 25 %   Lymphs Abs 1.6 0.7 - 4.0 K/uL   Monocytes Relative 9 %   Monocytes Absolute 0.6 0.1 - 1.0 K/uL    Eosinophils Relative 0 %   Eosinophils Absolute 0.0 0.0 - 0.5 K/uL   Basophils Relative 1 %   Basophils Absolute 0.0 0.0 - 0.1 K/uL   Immature Granulocytes 0 %   Abs Immature Granulocytes 0.01 0.00 - 0.07 K/uL  Lipase, blood     Status: None   Collection Time: 05/06/20  2:11 AM  Result Value Ref Range   Lipase 31 11 - 51 U/L  Comprehensive metabolic panel     Status: Abnormal   Collection Time: 05/06/20  2:11 AM  Result Value Ref Range   Sodium 138 135 - 145 mmol/L   Potassium 3.3 (L) 3.5 - 5.1 mmol/L   Chloride 104 98 - 111 mmol/L   CO2 26 22 - 32 mmol/L   Glucose, Bld 87 70 - 99 mg/dL   BUN 9 6 - 20 mg/dL   Creatinine, Ser 6.14 0.61 - 1.24 mg/dL   Calcium 9.1 8.9 - 43.1 mg/dL   Total Protein 7.4 6.5 - 8.1 g/dL   Albumin 4.8 3.5 - 5.0 g/dL   AST 37 15 - 41 U/L   ALT 24 0 - 44 U/L   Alkaline Phosphatase 63 38 - 126 U/L   Total Bilirubin 1.4 (H) 0.3 - 1.2 mg/dL   GFR, Estimated >54 >00 mL/min   Anion gap 8 5 - 15   ____________________________________________  EKG My review and personal interpretation at Time: 2:14   Indication: epigastric pain  Rate: 80  Rhythm: sinus Axis: normal Other: normal intervals, no stemi, unchanged from previous tracings ____________________________________________  RADIOLOGY  I personally reviewed all radiographic images ordered to evaluate for the above acute complaints and reviewed radiology reports and findings.  These findings were personally discussed with the patient.  Please see medical record for radiology report.  ____________________________________________   PROCEDURES  Procedure(s) performed:  Procedures    Critical Care performed: no ____________________________________________   INITIAL IMPRESSION / ASSESSMENT AND PLAN / ED COURSE  Pertinent labs & imaging results that were available during my care of the patient were reviewed by me and considered in my medical decision making (see chart for details).   DDX:  Gastritis, enteritis, mass, pancreatitis, hernia, reflux  Dwayne Macdonald is a 40 y.o. who presents to the ED with symptoms of epigastric pain as described above.  I suspicion for gastritis or reflux.  EKG unchanged from previous and has had multiple enzymes over the past week all of which have been negative.  He is not complaining of chest pain or shortness of breath at this time seems his symptoms are more epigastric in nature.  Given his multiple visits to the ER persistent discomfort CT imaging ordered which does not show any evidence of acute intra-abdominal process.  His blood work is reassuring.  His abdominal exam is benign.  Is not consistent with acute appendicitis perforation or abscess.  This point I do believe he stable and appropriate for outpatient follow-up.  Patient has received referral for GI states he will call make appointment.  Encourage patient to continue taking PPI and recommended some dietary changes which may help with his symptoms.  Patient agreeable to plan.  No additional questions or concerns.     The patient was evaluated in Emergency Department today for the symptoms described in the history of present illness. He/she was evaluated in the context of the global COVID-19 pandemic, which necessitated consideration that the patient might be at risk for infection with the SARS-CoV-2 virus that causes COVID-19. Institutional protocols and algorithms that pertain to the evaluation of patients at risk for COVID-19 are in a state of rapid change based on information released by regulatory bodies including the CDC and federal and state organizations. These policies and algorithms were followed during the patient's care in the ED.  As part of my medical decision making, I reviewed the following data within the electronic MEDICAL RECORD NUMBER Nursing notes reviewed and incorporated, Labs reviewed, notes from prior ED visits and [place] Controlled Substance  Database   ____________________________________________   FINAL CLINICAL IMPRESSION(S) / ED DIAGNOSES  Final diagnoses:  Epigastric pain      NEW MEDICATIONS STARTED DURING THIS VISIT:  New Prescriptions   No medications on file     Note:  This document was prepared using Dragon voice recognition software and may include unintentional dictation errors.    Willy Eddy, MD 05/06/20 [phone]

## 2020-05-06 NOTE — ED Triage Notes (Signed)
Pt states he has abd pain that radiates to his back, pt denies diarrhea. States he has n/v. Pt seen for same thing multiple times recently

## 2020-06-20 ENCOUNTER — Emergency Department
Admission: EM | Admit: 2020-06-20 | Discharge: 2020-06-20 | Disposition: A | Discharge status: Home / Self Care | Payer: Medicare Other | Attending: Emergency Medicine | Admitting: Emergency Medicine

## 2020-06-20 ENCOUNTER — Emergency Department: Payer: Medicare Other

## 2020-06-20 ENCOUNTER — Other Ambulatory Visit: Payer: Self-pay

## 2020-06-20 DIAGNOSIS — K219 Gastro-esophageal reflux disease without esophagitis: Secondary | ICD-10-CM

## 2020-06-20 DIAGNOSIS — R1010 Upper abdominal pain, unspecified: Secondary | ICD-10-CM | POA: Diagnosis present

## 2020-06-20 DIAGNOSIS — R109 Unspecified abdominal pain: Secondary | ICD-10-CM

## 2020-06-20 DIAGNOSIS — R1900 Intra-abdominal and pelvic swelling, mass and lump, unspecified site: Secondary | ICD-10-CM

## 2020-06-20 HISTORY — DX: Gastro-esophageal reflux disease without esophagitis: K21.9

## 2020-06-20 LAB — CBC WITH DIFFERENTIAL/PLATELET
Abs Immature Granulocytes: 0.01 10*3/uL (ref 0.00–0.07)
Basophils Absolute: 0 10*3/uL (ref 0.0–0.1)
Basophils Relative: 1 %
Eosinophils Absolute: 0 10*3/uL (ref 0.0–0.5)
Eosinophils Relative: 1 %
HCT: 36.9 % — ABNORMAL LOW (ref 39.0–52.0)
Hemoglobin: 13.3 g/dL (ref 13.0–17.0)
Immature Granulocytes: 0 %
Lymphocytes Relative: 33 %
Lymphs Abs: 1.1 10*3/uL (ref 0.7–4.0)
MCH: 30.7 pg (ref 26.0–34.0)
MCHC: 36 g/dL (ref 30.0–36.0)
MCV: 85.2 fL (ref 80.0–100.0)
Monocytes Absolute: 0.5 10*3/uL (ref 0.1–1.0)
Monocytes Relative: 14 %
Neutro Abs: 1.7 10*3/uL (ref 1.7–7.7)
Neutrophils Relative %: 51 %
Platelets: 201 10*3/uL (ref 150–400)
RBC: 4.33 MIL/uL (ref 4.22–5.81)
RDW: 11.9 % (ref 11.5–15.5)
WBC: 3.4 10*3/uL — ABNORMAL LOW (ref 4.0–10.5)
nRBC: 0 % (ref 0.0–0.2)

## 2020-06-20 LAB — COMPREHENSIVE METABOLIC PANEL
ALT: 16 U/L (ref 0–44)
AST: 19 U/L (ref 15–41)
Albumin: 4.5 g/dL (ref 3.5–5.0)
Alkaline Phosphatase: 65 U/L (ref 38–126)
Anion gap: 12 (ref 5–15)
BUN: 8 mg/dL (ref 6–20)
CO2: 25 mmol/L (ref 22–32)
Calcium: 9.3 mg/dL (ref 8.9–10.3)
Chloride: 101 mmol/L (ref 98–111)
Creatinine, Ser: 0.92 mg/dL (ref 0.61–1.24)
GFR, Estimated: >60 mL/min (ref >60)
Glucose, Bld: 91 mg/dL (ref 70–99)
Potassium: 3.1 mmol/L — ABNORMAL LOW (ref 3.5–5.1)
Sodium: 138 mmol/L (ref 135–145)
Total Bilirubin: 1.4 mg/dL — ABNORMAL HIGH (ref 0.3–1.2)
Total Protein: 7.5 g/dL (ref 6.5–8.1)

## 2020-06-20 LAB — LIPASE, BLOOD: Lipase: 36 U/L (ref 11–51)

## 2020-06-20 MED ORDER — ALUM & MAG HYDROXIDE-SIMETH 200-200-20 MG/5ML PO SUSP
30.0000 mL | Freq: Once | ORAL | Status: AC
  Administered 2020-06-20: 30 mL via ORAL
  Filled 2020-06-20: qty 30

## 2020-06-20 MED ORDER — DICYCLOMINE HCL 10 MG PO CAPS
10.0000 mg | ORAL_CAPSULE | Freq: Once | ORAL | Status: AC
  Administered 2020-06-20: 10 mg via ORAL
  Filled 2020-06-20: qty 1

## 2020-06-20 MED ORDER — OMEPRAZOLE MAGNESIUM 20 MG PO TBEC: 20.0000 mg | DELAYED_RELEASE_TABLET | Freq: Every day | ORAL | 1 refills | Status: DC

## 2020-06-20 MED ORDER — FAMOTIDINE IN NACL 20-0.9 MG/50ML-% IV SOLN
20.0000 mg | Freq: Once | INTRAVENOUS | Status: AC
  Administered 2020-06-20: 20 mg via INTRAVENOUS
  Filled 2020-06-20: qty 50

## 2020-06-20 MED ORDER — POTASSIUM CHLORIDE CRYS ER 20 MEQ PO TBCR
40.0000 meq | EXTENDED_RELEASE_TABLET | Freq: Once | ORAL | Status: AC
  Administered 2020-06-20: 40 meq via ORAL
  Filled 2020-06-20: qty 2

## 2020-06-20 MED ORDER — DICYCLOMINE HCL 10 MG PO CAPS: 10.0000 mg | ORAL_CAPSULE | Freq: Three times a day (TID) | ORAL | 0 refills | Status: DC | PRN

## 2020-06-20 MED ORDER — SUCRALFATE 1 G PO TABS: 1.0000 g | ORAL_TABLET | Freq: Four times a day (QID) | ORAL | 1 refills | Status: DC | PRN

## 2020-06-20 NOTE — ED Provider Notes (Signed)
[facility] Emergency Department Provider Note  ____________________________________________   Event Date/Time   First MD Initiated Contact with Patient 06/20/20 [phone]     (approximate)  I have reviewed the triage vital signs and the nursing notes.   HISTORY  Chief Complaint Abdominal Pain    HPI Dwayne Macdonald is a 40 y.o. male with multiple prior visits to emergency departments and urgent cares as well as his primary care doctor over the last 6 months for abdominal related complaints.  He presents tonight by EMS for acute worsening of the "swirling" sensation that he has in his abdomen.  He describes it as pain in the upper part of the abdomen.  He denies nausea and vomiting.  As previously described, he has been to the emergency department and urgent care several times and went to his primary care doctor yesterday.  According medical records, he has been taking omeprazole and Carafate but it does not seem to help consistently.  Nothing particular makes it better or worse, and specifically said he does not think that eating or drinking anything makes it worse.  He thinks that he is lost weight over the last few months because of his symptoms.  He denies fever/chills, sore throat, chest pain, shortness of breath, nausea, vomiting, diarrhea.  He asked the nurse "is it normal to have a pulse in your belly?"  because he can see his heartbeat in his abdomen.   He has no numbness nor tingling or weakness in any of his extremities.  He is not having trouble urinating or having bowel movements.          Past Medical History:  Diagnosis Date  . GERD (gastroesophageal reflux disease)    probable, based on symptoms and repeat ED visits    There are no problems to display for this patient.   History reviewed. No pertinent surgical history.  Prior to Admission medications   Medication Sig Start Date End Date Taking? Authorizing Provider  dicyclomine (BENTYL) 10  MG capsule Take 1 capsule (10 mg total) by mouth 3 (three) times daily as needed for up to 14 days for spasms. or abdominal pain 06/20/20 07/04/20 Yes Loleta Rose, MD  omeprazole (PRILOSEC OTC) 20 MG tablet Take 1 tablet (20 mg total) by mouth daily. 06/20/20 06/20/21 Yes Loleta Rose, MD  sucralfate (CARAFATE) 1 g tablet Take 1 tablet (1 g total) by mouth 4 (four) times daily as needed (for abdominal discomfort, nausea, and/or vomiting). 06/20/20  Yes Loleta Rose, MD  aluminum-magnesium hydroxide-simethicone (MAALOX) 200-200-20 MG/5ML SUSP Take 30 mLs by mouth 4 (four) times daily -  before meals and at bedtime. 05/05/20   Sharman Cheek, MD  metoCLOPramide (REGLAN) 10 MG tablet Take 1 tablet (10 mg total) by mouth every 6 (six) hours as needed. 05/05/20   Sharman Cheek, MD  omeprazole (PRILOSEC) 20 MG capsule Take 1 capsule (20 mg total) by mouth daily. 05/03/20 05/03/21  Chesley Noon, MD    Allergies Patient has no known allergies.  Family History  Problem Relation Age of Onset  . Healthy Mother   . Healthy Father     Social History Social History   Tobacco Use  . Smoking status: Never Smoker  . Smokeless tobacco: Never Used  Substance Use Topics  . Alcohol use: No  . Drug use: Never    Review of Systems Constitutional: No fever/chills Eyes: No visual changes. ENT: No sore throat. Cardiovascular: Denies chest pain.  Reports that he sees  his heartbeat in his abdomen. Respiratory: Denies shortness of breath. Gastrointestinal: "Swirling" abdominal pain, worse in the upper part of his abdomen. Genitourinary: Negative for dysuria. Musculoskeletal: Negative for neck pain.  Negative for back pain. Integumentary: Negative for rash. Neurological: Negative for headaches, focal weakness or numbness.   ____________________________________________   PHYSICAL EXAM:  VITAL SIGNS: ED Triage Vitals [06/20/20 0506]  Enc Vitals Group     BP (!) 148/94     Pulse Rate 74     Resp 16      Temp 97.6 F (36.4 C)     Temp Source Oral     SpO2 97 %     Weight 104.3 kg (229 lb 15 oz)     Height 1.753 m (5\' 9" )     Head Circumference      Peak Flow      Pain Score 8     Pain Loc      Pain Edu?      Excl. in GC?     Constitutional: Alert and oriented.  Eyes: Conjunctivae are normal.  Head: Atraumatic. Nose: No congestion/rhinnorhea. Mouth/Throat: Patient is wearing a mask. Neck: No stridor.  No meningeal signs.   Cardiovascular: Normal rate, regular rhythm. Good peripheral circulation. Respiratory: Normal respiratory effort.  No retractions. Gastrointestinal: Soft with some mild tenderness to palpation of the epigastrium and reported tenderness in the right upper quadrant but negative Murphy sign.  The patient has no abdominal bruit.  If he lies flat, you can see his pulse above the umbilicus, but there is no "bulge", bruit, nor hernia.  No tenderness to palpation over the pulse. Musculoskeletal: No lower extremity tenderness nor edema. No gross deformities of extremities. Neurologic:  Normal speech and language. No gross focal neurologic deficits are appreciated.  Skin:  Skin is warm, dry and intact. Psychiatric: Mood and affect are normal. Speech and behavior are normal.  ____________________________________________   LABS (all labs ordered are listed, but only abnormal results are displayed)  Labs Reviewed  CBC WITH DIFFERENTIAL/PLATELET - Abnormal; Notable for the following components:      Result Value   WBC 3.4 (*)    HCT 36.9 (*)    All other components within normal limits  COMPREHENSIVE METABOLIC PANEL - Abnormal; Notable for the following components:   Potassium 3.1 (*)    Total Bilirubin 1.4 (*)    All other components within normal limits  LIPASE, BLOOD   ____________________________________________  EKG  No indication for emergent EKG ____________________________________________  RADIOLOGY I, Loleta Roseory Jurnie Garritano, personally viewed and evaluated  these images (plain radiographs) as part of my medical decision making, as well as reviewing the written report by the radiologist.  ED MD interpretation:  No acute abnormalities on U/S  Official radiology report(s): US AORTA DUPLEX LIMITED  Result Date: 06/20/2020 CLINICAL DATA:  Pulsatile abdominal mass EXAM: ULTRASOUND OF ABDOMINAL AORTA TECHNIQUE: Ultrasound examination of the abdominal aorta and proximal common iliac arteries was performed to evaluate for aneurysm. Additional color and Doppler images of the distal aorta were obtained to document patency. COMPARISON:  Abdominal CT from 05/06/2020 FINDINGS: Abdominal aortic measurements as follows: Proximal:  22 x 19 mm mm Mid:  18 x 17 mm mm Distal:  17 x 15 mm mm Patent: Yes, peak systolic velocity is 160 cm/s Right common iliac artery: 1.1 cm Left common iliac artery: 1.1 cm IMPRESSION: Normal abdominal aorta. Electronically Signed   By: Marnee SpringJonathon  Watts M.D.   On: 06/20/2020 06:01   [place]  ABDOMEN LIMITED RUQ (LIVER/GB)  Result Date: 06/20/2020 CLINICAL DATA:  Diffusely radiating epigastric pain for a few days EXAM: ULTRASOUND ABDOMEN LIMITED RIGHT UPPER QUADRANT COMPARISON:  Abdominal CT 05/06/2020 FINDINGS: Gallbladder: No gallstones or wall thickening visualized. No sonographic Murphy sign noted by sonographer. Common bile duct: Diameter: 4 mm Liver: No focal lesion identified. Within normal limits in parenchymal echogenicity. Portal vein is patent on color Doppler imaging with normal direction of blood flow towards the liver. IMPRESSION: Normal right upper quadrant ultrasound. Electronically Signed   By: Marnee Spring M.D.   On: 06/20/2020 06:00    ____________________________________________   PROCEDURES   Procedure(s) performed (including Critical Care):  Procedures   ____________________________________________   INITIAL IMPRESSION / MDM / ASSESSMENT AND PLAN / ED COURSE  As part of my medical decision making, I reviewed the  following data within the electronic MEDICAL RECORD NUMBER Nursing notes reviewed and incorporated, Labs reviewed , Old chart reviewed and Notes from prior ED visits   Differential diagnosis includes, but is not limited to, acid reflux, biliary colic, gastritis, AAA.  I reviewed the medical record and the patient has been seen multiple times and had extensive work-ups in the emergency department including CT scans of the abdomen.  He saw his PCP yesterday.  He reportedly was going to go to a gastroenterologist, but I do not see any record that he did so.  He has been trying omeprazole and Carafate to little success.  I reviewed the prior CT scan it was generally reassuring but suggested some degree of gallbladder sludge without evidence of cholecystitis.  Given his persistent symptoms I think it is reasonable to obtain a right upper quadrant ultrasound to rule out stones and cholecystitis.  Additionally, given his report of a "pulse" in his abdomen, I think it is reasonable to obtain an ultrasound of the aorta, but he has no bruit nor hernia and I think it is unlikely he is suffering from a AAA.  Will likely proceed with conservative medical management.  Basic labs are pending including CBC, CMP, and lipase.  The patient has had extensive cardiac evaluations in the past and he is not reporting chest pain so I will not proceed with EKG nor troponin tonight.  Patient understands and agrees with the plan.       Clinical Course as of 06/20/20 7628  Sat Jun 20, 2020  0509 Labs generally reassuring other than some mild hypokalemia of 3.1.  Ordered potassium 40 mEq to replete.  I do not think this is the cause of his symptoms.  Ultrasound of the right upper quadrant and the aorta are reassuring and normal.  I believe the patient has continued to have symptoms consistent with acid reflux.  He told me he stopped taking his omeprazole and Carafate.  I am giving you a dose of Maalox that includes simethicone, as  well as famotidine 20 mg IV and Bentyl 10 mg by mouth.  I encouraged him to follow-up with his primary care doctor but also give him follow-up information with Dr. Tobi Bastos with GI.  He thinks he might of seen a GI doctor in [place] but I see no evidence of this in care everywhere.  I think he would benefit from GI follow-up.  I gave my usual and customary return precautions. [CF]  0531 CBC with Differential/Platelet(!) Very mild leukopenia of unclear clinical significance. [CF]    Clinical Course User Index [CF] Loleta Rose, MD     ____________________________________________  FINAL  CLINICAL IMPRESSION(S) / ED DIAGNOSES  Final diagnoses:  Gastroesophageal reflux disease, unspecified whether esophagitis present  Upper abdominal pain     MEDICATIONS GIVEN DURING THIS VISIT:  Medications  famotidine (PEPCID) IVPB 20 mg premix (20 mg Intravenous New Bag/Given 06/20/20 0705)  potassium chloride SA (KLOR-CON) CR tablet 40 mEq (has no administration in time range)  alum & mag hydroxide-simeth (MAALOX/MYLANTA) 200-200-20 MG/5ML suspension 30 mL (30 mLs Oral Given 06/20/20 0705)  dicyclomine (BENTYL) capsule 10 mg (10 mg Oral Given 06/20/20 0705)     ED Discharge Orders         Ordered    dicyclomine (BENTYL) 10 MG capsule  3 times daily PRN        06/20/20 0710    omeprazole (PRILOSEC OTC) 20 MG tablet  Daily        06/20/20 0710    sucralfate (CARAFATE) 1 g tablet  4 times daily PRN        06/20/20 0710          *Please note:  Dwayne Macdonald was evaluated in Emergency Department on 06/20/2020 for the symptoms described in the history of present illness. He was evaluated in the context of the global COVID-19 pandemic, which necessitated consideration that the patient might be at risk for infection with the SARS-CoV-2 virus that causes COVID-19. Institutional protocols and algorithms that pertain to the evaluation of patients at risk for COVID-19 are in a state of rapid change  based on information released by regulatory bodies including the CDC and federal and state organizations. These policies and algorithms were followed during the patient's care in the ED.  Some ED evaluations and interventions may be delayed as a result of limited staffing during and after the pandemic.*  Note:  This document was prepared using Dragon voice recognition software and may include unintentional dictation errors.   Loleta Rose, MD 06/20/20 [phone]

## 2020-06-20 NOTE — Discharge Instructions (Addendum)
You have been seen in the Emergency Department (ED) for abdominal pain.  Your evaluation did not identify a clear cause of your symptoms but was generally reassuring.  Please follow up as instructed above regarding today's emergent visit and the symptoms that are bothering you.  Try taking the prescribed medications and see if that helps.  Follow up with your regular doctor, and consider going to see Dr. Tobi Bastos or one of his GI specialist colleagues, if you do not already see a gastroenterologist.  Return to the ED if your abdominal pain worsens or fails to improve, you develop bloody vomiting, bloody diarrhea, you are unable to tolerate fluids due to vomiting, fever greater than 101, or other symptoms that concern you.

## 2020-06-20 NOTE — ED Triage Notes (Signed)
[place] [facility] [place] [facility] c/o general abd pain, feels better with standing/walking, increases with laying down, denies n/v/d. Taking mucinex for sinus infection

## 2020-09-10 ENCOUNTER — Emergency Department
Admission: EM | Admit: 2020-09-10 | Discharge: 2020-09-10 | Disposition: A | Discharge status: Home / Self Care | Payer: Medicare Other | Attending: Emergency Medicine | Admitting: Emergency Medicine

## 2020-09-10 ENCOUNTER — Other Ambulatory Visit: Payer: Self-pay

## 2020-09-10 DIAGNOSIS — R1032 Left lower quadrant pain: Secondary | ICD-10-CM | POA: Diagnosis present

## 2020-09-10 DIAGNOSIS — Z79899 Other long term (current) drug therapy: Secondary | ICD-10-CM | POA: Diagnosis not present

## 2020-09-10 DIAGNOSIS — K219 Gastro-esophageal reflux disease without esophagitis: Secondary | ICD-10-CM | POA: Insufficient documentation

## 2020-09-10 LAB — CBC WITH DIFFERENTIAL/PLATELET
Abs Immature Granulocytes: 0.01 10*3/uL (ref 0.00–0.07)
Basophils Absolute: 0 10*3/uL (ref 0.0–0.1)
Basophils Relative: 1 %
Eosinophils Absolute: 0.1 10*3/uL (ref 0.0–0.5)
Eosinophils Relative: 3 %
HCT: 38.7 % — ABNORMAL LOW (ref 39.0–52.0)
Hemoglobin: 13.2 g/dL (ref 13.0–17.0)
Immature Granulocytes: 0 %
Lymphocytes Relative: 35 %
Lymphs Abs: 1.3 10*3/uL (ref 0.7–4.0)
MCH: 29.6 pg (ref 26.0–34.0)
MCHC: 34.1 g/dL (ref 30.0–36.0)
MCV: 86.8 fL (ref 80.0–100.0)
Monocytes Absolute: 0.3 10*3/uL (ref 0.1–1.0)
Monocytes Relative: 9 %
Neutro Abs: 2.1 10*3/uL (ref 1.7–7.7)
Neutrophils Relative %: 52 %
Platelets: 223 10*3/uL (ref 150–400)
RBC: 4.46 MIL/uL (ref 4.22–5.81)
RDW: 12.1 % (ref 11.5–15.5)
WBC: 3.9 10*3/uL — ABNORMAL LOW (ref 4.0–10.5)
nRBC: 0 % (ref 0.0–0.2)

## 2020-09-10 LAB — URINALYSIS, COMPLETE (UACMP) WITH MICROSCOPIC
Bilirubin Urine: NEGATIVE
Glucose, UA: NEGATIVE mg/dL
Hgb urine dipstick: NEGATIVE
Ketones, ur: NEGATIVE mg/dL
Leukocytes,Ua: NEGATIVE
Nitrite: NEGATIVE
Protein, ur: NEGATIVE mg/dL
Specific Gravity, Urine: 1.011 (ref 1.005–1.030)
Squamous Epithelial / HPF: NONE SEEN (ref 0–5)
pH: 7 (ref 5.0–8.0)

## 2020-09-10 LAB — URINE DRUG SCREEN, QUALITATIVE (ARMC ONLY)
Amphetamines, Ur Screen: NOT DETECTED
Barbiturates, Ur Screen: NOT DETECTED
Benzodiazepine, Ur Scrn: NOT DETECTED
Cannabinoid 50 Ng, Ur ~~LOC~~: NOT DETECTED
Cocaine Metabolite,Ur ~~LOC~~: NOT DETECTED
MDMA (Ecstasy)Ur Screen: NOT DETECTED
Methadone Scn, Ur: NOT DETECTED
Opiate, Ur Screen: NOT DETECTED
Phencyclidine (PCP) Ur S: NOT DETECTED
Tricyclic, Ur Screen: NOT DETECTED

## 2020-09-10 LAB — COMPREHENSIVE METABOLIC PANEL
ALT: 18 U/L (ref 0–44)
AST: 17 U/L (ref 15–41)
Albumin: 4.2 g/dL (ref 3.5–5.0)
Alkaline Phosphatase: 57 U/L (ref 38–126)
Anion gap: 7 (ref 5–15)
BUN: 14 mg/dL (ref 6–20)
CO2: 30 mmol/L (ref 22–32)
Calcium: 9.4 mg/dL (ref 8.9–10.3)
Chloride: 104 mmol/L (ref 98–111)
Creatinine, Ser: 0.86 mg/dL (ref 0.61–1.24)
GFR, Estimated: >60 mL/min (ref >60)
Glucose, Bld: 96 mg/dL (ref 70–99)
Potassium: 3.8 mmol/L (ref 3.5–5.1)
Sodium: 141 mmol/L (ref 135–145)
Total Bilirubin: 0.7 mg/dL (ref 0.3–1.2)
Total Protein: 7 g/dL (ref 6.5–8.1)

## 2020-09-10 LAB — LIPASE, BLOOD: Lipase: 37 U/L (ref 11–51)

## 2020-09-10 MED ORDER — SIMETHICONE 80 MG PO CHEW: 80.0000 mg | CHEWABLE_TABLET | Freq: Four times a day (QID) | ORAL | 0 refills | Status: DC | PRN

## 2020-09-10 NOTE — ED Notes (Signed)
Pt states R lateral side pain has been ongoing for about 3 months and states today the pain is worse. Pt denies injury or trauma to this area. Pt denies urinary symptoms. NAD noted. Pt is A&Ox4.

## 2020-09-10 NOTE — ED Provider Notes (Signed)
[facility] Emergency Department Provider Note   ____________________________________________   Event Date/Time   First MD Initiated Contact with Patient 09/10/20 [phone]     (approximate)  I have reviewed the triage vital signs and the nursing notes.   HISTORY  Chief Complaint Abdominal Pain    HPI Dwayne Macdonald is a 40 y.o. male with the below stated past medical history who presents for abdominal pain  LOCATION: Left lower quadrant/left flank DURATION: 3 months TIMING: Intermittent SEVERITY: 4/10 QUALITY: "Movement" CONTEXT: Patient states that he occasionally feels movement in his left lower quadrant abdomen as well as left flank that radiates down into the suprapubic region and lower part of his abdomen MODIFYING FACTORS: States slightly worsened when he lays down and denies any relieving factors ASSOCIATED SYMPTOMS: Denies any nausea/vomiting/diarrhea/constipation   Per medical record review patient has history of GERD          Past Medical History:  Diagnosis Date   GERD (gastroesophageal reflux disease)    probable, based on symptoms and repeat ED visits    There are no problems to display for this patient.   History reviewed. No pertinent surgical history.  Prior to Admission medications   Medication Sig Start Date End Date Taking? Authorizing Provider  simethicone (GAS-X) 80 MG chewable tablet Chew 1 tablet (80 mg total) by mouth 4 (four) times daily as needed (abdominal pain). 09/10/20 10/10/20 Yes Merwyn Katos, MD  aluminum-magnesium hydroxide-simethicone (MAALOX) 200-200-20 MG/5ML SUSP Take 30 mLs by mouth 4 (four) times daily -  before meals and at bedtime. 05/05/20   Sharman Cheek, MD  dicyclomine (BENTYL) 10 MG capsule Take 1 capsule (10 mg total) by mouth 3 (three) times daily as needed for up to 14 days for spasms. or abdominal pain 06/20/20 07/04/20  Loleta Rose, MD  metoCLOPramide (REGLAN) 10 MG tablet Take 1 tablet  (10 mg total) by mouth every 6 (six) hours as needed. 05/05/20   Sharman Cheek, MD  omeprazole (PRILOSEC OTC) 20 MG tablet Take 1 tablet (20 mg total) by mouth daily. 06/20/20 06/20/21  Loleta Rose, MD  omeprazole (PRILOSEC) 20 MG capsule Take 1 capsule (20 mg total) by mouth daily. 05/03/20 05/03/21  Chesley Noon, MD  sucralfate (CARAFATE) 1 g tablet Take 1 tablet (1 g total) by mouth 4 (four) times daily as needed (for abdominal discomfort, nausea, and/or vomiting). 06/20/20   Loleta Rose, MD    Allergies Patient has no known allergies.  Family History  Problem Relation Age of Onset   Healthy Mother    Healthy Father     Social History Social History   Tobacco Use   Smoking status: Never   Smokeless tobacco: Never  Substance Use Topics   Alcohol use: No   Drug use: Never    Review of Systems Constitutional: No fever/chills Eyes: No visual changes. ENT: No sore throat. Cardiovascular: Denies chest pain. Respiratory: Denies shortness of breath. Gastrointestinal: Endorses left lower quadrant abdominal pain.  No nausea, no vomiting.  No diarrhea. Genitourinary: Negative for dysuria. Musculoskeletal: Negative for acute arthralgias Skin: Negative for rash. Neurological: Negative for headaches, weakness/numbness/paresthesias in any extremity Psychiatric: Negative for suicidal ideation/homicidal ideation   ____________________________________________   PHYSICAL EXAM:  VITAL SIGNS: ED Triage Vitals [09/10/20 0718]  Enc Vitals Group     BP (!) 139/96     Pulse Rate 70     Resp 18     Temp 98.3 F (36.8 C)  Temp src      SpO2 97 %     Weight 180 lb (81.6 kg)     Height 5\' 8"  (1.727 m)     Head Circumference      Peak Flow      Pain Score 4     Pain Loc      Pain Edu?      Excl. in GC?    Constitutional: Alert and oriented. Well appearing and in no acute distress. Eyes: Conjunctivae are normal. PERRL. Head: Atraumatic. Nose: No  congestion/rhinnorhea. Mouth/Throat: Mucous membranes are moist. Neck: No stridor Cardiovascular: Grossly normal heart sounds.  Good peripheral circulation. Respiratory: Normal respiratory effort.  No retractions. Gastrointestinal: Soft and nontender. No distention. Musculoskeletal: No obvious deformities Neurologic:  Normal speech and language. No gross focal neurologic deficits are appreciated. Skin:  Skin is warm and dry. No rash noted. Psychiatric: Mood and affect are normal. Speech and behavior are normal.  ____________________________________________   LABS (all labs ordered are listed, but only abnormal results are displayed)  Labs Reviewed  CBC WITH DIFFERENTIAL/PLATELET - Abnormal; Notable for the following components:      Result Value   WBC 3.9 (*)    HCT 38.7 (*)    All other components within normal limits  URINALYSIS, COMPLETE (UACMP) WITH MICROSCOPIC - Abnormal; Notable for the following components:   Color, Urine YELLOW (*)    APPearance CLEAR (*)    Bacteria, UA RARE (*)    All other components within normal limits  COMPREHENSIVE METABOLIC PANEL  LIPASE, BLOOD  URINE DRUG SCREEN, QUALITATIVE ([facility] ONLY)   ____________________________________________  PROCEDURES  Procedure(s) performed (including Critical Care):  .1-3 Lead EKG Interpretation  Date/Time: 09/10/2020 9:06 AM Performed by: 09/12/2020, MD Authorized by: Merwyn Katos, MD     Interpretation: normal     ECG rate:  69   ECG rate assessment: normal     Rhythm: sinus rhythm     Ectopy: none     Conduction: normal     ____________________________________________   INITIAL IMPRESSION / ASSESSMENT AND PLAN / ED COURSE  As part of my medical decision making, I reviewed the following data within the electronic medical record, if available:  Nursing notes reviewed and incorporated, Labs reviewed, EKG interpreted, Old chart reviewed, Radiograph reviewed and Notes from prior ED visits  reviewed and incorporated      Patient presents for abdominal pain.  Differential diagnosis includes appendicitis, abdominal aortic aneurysm, surgical biliary disease, pancreatitis, SBO, mesenteric ischemia, serious intra-abdominal bacterial illness, genital torsion. Doubt atypical ACS. Based on history, physical exam, radiologic/laboratory evaluation, there is no red flag results or symptomatology requiring emergent intervention or need for admission at this time Pt tolerating PO. Disposition: Patient will be discharged with strict return precautions and follow up with primary MD within 12-24 hours for further evaluation. Patient understands that this still may have an early presentation of an emergent medical condition such as appendicitis that will require a recheck.      ____________________________________________   FINAL CLINICAL IMPRESSION(S) / ED DIAGNOSES  Final diagnoses:  Left lower quadrant abdominal pain     ED Discharge Orders          Ordered    simethicone (GAS-X) 80 MG chewable tablet  4 times daily PRN        09/10/20 0855             Note:  This document was prepared using Dragon voice recognition software  and may include unintentional dictation errors.    Merwyn Katos, MD 09/10/20 [phone]

## 2020-09-10 NOTE — ED Triage Notes (Signed)
Pt came ems from home; c/o abd pain on right side mid-lower radiating to the side. Pt seems unsure of himself and how long the pain has been going on.

## 2020-09-20 ENCOUNTER — Emergency Department
Admission: EM | Admit: 2020-09-20 | Discharge: 2020-09-20 | Disposition: A | Discharge status: Home / Self Care | Payer: Medicare Other | Attending: Emergency Medicine | Admitting: Emergency Medicine

## 2020-09-20 ENCOUNTER — Other Ambulatory Visit: Payer: Self-pay

## 2020-09-20 ENCOUNTER — Encounter: Payer: Self-pay | Admitting: Emergency Medicine

## 2020-09-20 DIAGNOSIS — Z28310 Unvaccinated for covid-19: Secondary | ICD-10-CM | POA: Insufficient documentation

## 2020-09-20 DIAGNOSIS — R509 Fever, unspecified: Secondary | ICD-10-CM | POA: Diagnosis present

## 2020-09-20 DIAGNOSIS — U071 COVID-19: Secondary | ICD-10-CM | POA: Insufficient documentation

## 2020-09-20 LAB — RESP PANEL BY RT-PCR (FLU A&B, COVID) ARPGX2
Influenza A by PCR: NEGATIVE
Influenza B by PCR: NEGATIVE
SARS Coronavirus 2 by RT PCR: POSITIVE — AB

## 2020-09-20 MED ORDER — IBUPROFEN 800 MG PO TABS
800.0000 mg | ORAL_TABLET | Freq: Once | ORAL | Status: AC
  Administered 2020-09-20: 800 mg via ORAL
  Filled 2020-09-20: qty 1

## 2020-09-20 NOTE — ED Notes (Signed)
See triage note  Presents with low grade fever and body aches  States sx's started a few days ago  Became worse this am

## 2020-09-20 NOTE — ED Provider Notes (Signed)
[facility] Emergency Department Provider Note  Time seen: 12:24 PM  I have reviewed the triage vital signs and the nursing notes.   HISTORY  Chief Complaint Headache, Generalized Body Aches, and Fever   HPI Dwayne Macdonald is a 40 y.o. male with a past medical history of gastric reflux presents to the emergency department for 2 to 3 days of subjective fever, cough body aches and headache.  Patient states he has not had COVID, is not vaccinated.  Denies any known sick contacts.  No chest pain or abdominal pain.  States generalized body aches somewhat worse in the back.   Past Medical History:  Diagnosis Date   GERD (gastroesophageal reflux disease)    probable, based on symptoms and repeat ED visits    There are no problems to display for this patient.   History reviewed. No pertinent surgical history.  Prior to Admission medications   Medication Sig Start Date End Date Taking? Authorizing Provider  aluminum-magnesium hydroxide-simethicone (MAALOX) 200-200-20 MG/5ML SUSP Take 30 mLs by mouth 4 (four) times daily -  before meals and at bedtime. 05/05/20   Sharman Cheek, MD  dicyclomine (BENTYL) 10 MG capsule Take 1 capsule (10 mg total) by mouth 3 (three) times daily as needed for up to 14 days for spasms. or abdominal pain 06/20/20 07/04/20  Loleta Rose, MD  metoCLOPramide (REGLAN) 10 MG tablet Take 1 tablet (10 mg total) by mouth every 6 (six) hours as needed. 05/05/20   Sharman Cheek, MD  omeprazole (PRILOSEC OTC) 20 MG tablet Take 1 tablet (20 mg total) by mouth daily. 06/20/20 06/20/21  Loleta Rose, MD  omeprazole (PRILOSEC) 20 MG capsule Take 1 capsule (20 mg total) by mouth daily. 05/03/20 05/03/21  Chesley Noon, MD  simethicone (GAS-X) 80 MG chewable tablet Chew 1 tablet (80 mg total) by mouth 4 (four) times daily as needed (abdominal pain). 09/10/20 10/10/20  Merwyn Katos, MD  sucralfate (CARAFATE) 1 g tablet Take 1 tablet (1 g total) by mouth 4  (four) times daily as needed (for abdominal discomfort, nausea, and/or vomiting). 06/20/20   Loleta Rose, MD    No Known Allergies  Family History  Problem Relation Age of Onset   Healthy Mother    Healthy Father     Social History Social History   Tobacco Use   Smoking status: Never   Smokeless tobacco: Never  Substance Use Topics   Alcohol use: No   Drug use: Never    Review of Systems Constitutional: Subjective fever x2 to 3 days.  Positive generalized fatigue. Cardiovascular: Negative for chest pain. Respiratory: Negative for shortness of breath.  Positive for cough. Gastrointestinal: Negative for abdominal pain, vomiting and diarrhea. Genitourinary: Negative for urinary compaints Musculoskeletal: Generalized body aches Skin: Negative for skin complaints  Neurological: Positive for headache. All other ROS negative  ____________________________________________   PHYSICAL EXAM:  VITAL SIGNS: ED Triage Vitals  Enc Vitals Group     BP 09/20/20 1027 (!) 142/81     Pulse Rate 09/20/20 1027 (!) 101     Resp 09/20/20 1027 20     Temp 09/20/20 1027 100.3 F (37.9 C)     Temp Source 09/20/20 1027 Oral     SpO2 09/20/20 1027 100 %     Weight 09/20/20 1028 230 lb (104.3 kg)     Height 09/20/20 1028 5\' 9"  (1.753 m)     Head Circumference --      Peak Flow --  Pain Score 09/20/20 1028 10     Pain Loc --      Pain Edu? --      Excl. in GC? --    Constitutional: Alert and oriented. Well appearing and in no distress. Eyes: Normal exam ENT      Head: Normocephalic and atraumatic.      Mouth/Throat: Mucous membranes are moist. Cardiovascular: Normal rate, regular rhythm.  Respiratory: Normal respiratory effort without tachypnea nor retractions. Breath sounds are clear  Gastrointestinal: Soft and nontender. No distention. Musculoskeletal: Nontender with normal range of motion in all extremities. Neurologic:  Normal speech and language. No gross focal neurologic  deficit Skin:  Skin is warm, dry and intact.  Psychiatric: Mood and affect are normal.  ____________________________________________   INITIAL IMPRESSION / ASSESSMENT AND PLAN / ED COURSE  Pertinent labs & imaging results that were available during my care of the patient were reviewed by me and considered in my medical decision making (see chart for details).   Patient presents emergency department for generalized body aches fatigue cough headache.  Symptoms have been ongoing for the past 2 to 3 days.  Febrile to 100.3 in the emergency department.  Symptoms are very suggestive of viral upper respiratory infection.  No significantly concerning findings on vital signs or physical exam.  We will check for COVID/influenza.  We will treat with ibuprofen and continue to closely monitor.  Patient's COVID test is positive.  Dwayne Macdonald was evaluated in Emergency Department on 09/20/2020 for the symptoms described in the history of present illness. He was evaluated in the context of the global COVID-19 pandemic, which necessitated consideration that the patient might be at risk for infection with the SARS-CoV-2 virus that causes COVID-19. Institutional protocols and algorithms that pertain to the evaluation of patients at risk for COVID-19 are in a state of rapid change based on information released by regulatory bodies including the CDC and federal and state organizations. These policies and algorithms were followed during the patient's care in the ED.  ____________________________________________   FINAL CLINICAL IMPRESSION(S) / ED DIAGNOSES  COVID-19   Minna Antis, MD 09/20/20 1346

## 2020-09-20 NOTE — ED Triage Notes (Signed)
Pt [place] via EMS [place] the side of the road with c/o generalized weakness, HR 107, 99% RA, FSBS 101, Temp 101.2, 125/78. Pt reports SOB, HE and generalized [employer]

## 2020-09-20 NOTE — ED Triage Notes (Signed)
Pt reports fever, HA and general bodyaches for a few days. Denies recent exposures.

## 2020-10-18 ENCOUNTER — Emergency Department
Admission: EM | Admit: 2020-10-18 | Discharge: 2020-10-18 | Disposition: A | Discharge status: Home / Self Care | Payer: Medicare Other | Attending: Emergency Medicine | Admitting: Emergency Medicine

## 2020-10-18 ENCOUNTER — Other Ambulatory Visit: Payer: Self-pay

## 2020-10-18 DIAGNOSIS — K219 Gastro-esophageal reflux disease without esophagitis: Secondary | ICD-10-CM | POA: Diagnosis not present

## 2020-10-18 DIAGNOSIS — Z79899 Other long term (current) drug therapy: Secondary | ICD-10-CM | POA: Diagnosis not present

## 2020-10-18 DIAGNOSIS — M549 Dorsalgia, unspecified: Secondary | ICD-10-CM | POA: Diagnosis present

## 2020-10-18 MED ORDER — PANTOPRAZOLE SODIUM 40 MG PO TBEC: 40.0000 mg | DELAYED_RELEASE_TABLET | Freq: Every day | ORAL | 1 refills | Status: DC

## 2020-10-18 MED ORDER — PANTOPRAZOLE SODIUM 40 MG PO TBEC
40.0000 mg | DELAYED_RELEASE_TABLET | Freq: Every day | ORAL | Status: DC
  Administered 2020-10-18: 40 mg via ORAL
  Filled 2020-10-18: qty 1

## 2020-10-18 NOTE — ED Provider Notes (Signed)
[facility] Emergency Department Provider Note  ____________________________________________   Event Date/Time   First MD Initiated Contact with Patient 10/18/20 1041     (approximate)  I have reviewed the triage vital signs and the nursing notes.   HISTORY  Chief Complaint Back Pain    HPI Dwayne Macdonald is a 40 y.o. male presents emergency department with vague complaints.  States his back feels like his muscles are moving all around, some squeezing in his chest that will make him burp.  Patient has multiple medical complaints that are vague in nature.  Patient has been seen for these complaints previously.  He has not followed up with a PCP.  Past Medical History:  Diagnosis Date   GERD (gastroesophageal reflux disease)    probable, based on symptoms and repeat ED visits    There are no problems to display for this patient.   No past surgical history on file.  Prior to Admission medications   Medication Sig Start Date End Date Taking? Authorizing Provider  pantoprazole (PROTONIX) 40 MG tablet Take 1 tablet (40 mg total) by mouth daily. 10/18/20 10/18/21 Yes Latrice Storlie, Roselyn Bering, PA-C  aluminum-magnesium hydroxide-simethicone (MAALOX) 200-200-20 MG/5ML SUSP Take 30 mLs by mouth 4 (four) times daily -  before meals and at bedtime. 05/05/20   Sharman Cheek, MD    Allergies Patient has no known allergies.  Family History  Problem Relation Age of Onset   Healthy Mother    Healthy Father     Social History Social History   Tobacco Use   Smoking status: Never   Smokeless tobacco: Never  Substance Use Topics   Alcohol use: No   Drug use: Never    Review of Systems  Constitutional: No fever/chills Eyes: No visual changes. ENT: No sore throat. Respiratory: Denies cough Cardiovascular: Denies chest pain Gastrointestinal: Denies abdominal pain Genitourinary: Negative for dysuria. Musculoskeletal: Negative for back pain. Skin: Negative  for rash. Psychiatric: no mood changes,     ____________________________________________   PHYSICAL EXAM:  VITAL SIGNS: ED Triage Vitals  Enc Vitals Group     BP 10/18/20 0608 120/83     Pulse Rate 10/18/20 0608 75     Resp 10/18/20 0608 16     Temp 10/18/20 0608 98.5 F (36.9 C)     Temp Source 10/18/20 0608 Oral     SpO2 10/18/20 0608 99 %     Weight 10/18/20 0615 230 lb (104.3 kg)     Height 10/18/20 0615 5\' 9"  (1.753 m)     Head Circumference --      Peak Flow --      Pain Score 10/18/20 0614 9     Pain Loc --      Pain Edu? --      Excl. in GC? --     Constitutional: Alert and oriented. Well appearing and in no acute distress. Eyes: Conjunctivae are normal.  Head: Atraumatic. Nose: No congestion/rhinnorhea. Mouth/Throat: Mucous membranes are moist.   Neck:  supple no lymphadenopathy noted Cardiovascular: Normal rate, regular rhythm. Heart sounds are normal Respiratory: Normal respiratory effort.  No retractions, lungs c t a  Abd: soft nontender bs normal all 4 quad GU: deferred Musculoskeletal: FROM all extremities, warm and well perfused Neurologic:  Normal speech and language.  Skin:  Skin is warm, dry and intact. No rash noted. Psychiatric: Mood and affect are normal. Speech and behavior are normal.  ____________________________________________   LABS (all labs ordered are listed,  but only abnormal results are displayed)  Labs Reviewed - No data to display ____________________________________________   ____________________________________________  RADIOLOGY    ____________________________________________   PROCEDURES  Procedure(s) performed: No  Procedures    ____________________________________________   INITIAL IMPRESSION / ASSESSMENT AND PLAN / ED COURSE  Pertinent labs & imaging results that were available during my care of the patient were reviewed by me and considered in my medical decision making (see chart for details).    Patient is a 40 year old male presents emergency department multiple complaints.  No abdominal findings on exam.  We will give the patient a trial for esophagitis/reflux.  He was given Protonix.  A prescription for Protonix.  He is to follow-up with her regular doctor.  Since he lives in [place] I did give him the phone number for [address] [facility].  He was discharged stable condition.     NUR KRASINSKI was evaluated in Emergency Department on 10/18/2020 for the symptoms described in the history of present illness. He was evaluated in the context of the global COVID-19 pandemic, which necessitated consideration that the patient might be at risk for infection with the SARS-CoV-2 virus that causes COVID-19. Institutional protocols and algorithms that pertain to the evaluation of patients at risk for COVID-19 are in a state of rapid change based on information released by regulatory bodies including the CDC and federal and state organizations. These policies and algorithms were followed during the patient's care in the ED.    As part of my medical decision making, I reviewed the following data within the electronic MEDICAL RECORD NUMBER Nursing notes reviewed and incorporated, Old chart reviewed, Notes from prior ED visits, and [place] Controlled Substance Database  ____________________________________________   FINAL CLINICAL IMPRESSION(S) / ED DIAGNOSES  Final diagnoses:  Gastroesophageal reflux disease, unspecified whether esophagitis present      NEW MEDICATIONS STARTED DURING THIS VISIT:  New Prescriptions   PANTOPRAZOLE (PROTONIX) 40 MG TABLET    Take 1 tablet (40 mg total) by mouth daily.     Note:  This document was prepared using Dragon voice recognition software and may include unintentional dictation errors.    Faythe Ghee, PA-C 10/18/20 1154    Concha Se, MD 10/18/20 1210

## 2020-10-18 NOTE — ED Triage Notes (Signed)
Pt to ED via [facility] with c/o generalized chronic back pain x several months. Per EMS pt was picked up from the parking lot of a dentist office after what appeared to be walking. Pt ambulatory from EMS bay to lobby.     70HR 96% RA 137/104

## 2020-10-18 NOTE — ED Notes (Signed)
See triage note  Presents with back pain  States he feels like his whole back "is moving all over"  Was found walking around the parking lot near [place]  Denies any injury  Ambulates well

## 2020-12-10 ENCOUNTER — Encounter: Payer: Self-pay | Admitting: Emergency Medicine

## 2020-12-10 ENCOUNTER — Emergency Department: Payer: Medicare Other

## 2020-12-10 ENCOUNTER — Emergency Department
Admission: EM | Admit: 2020-12-10 | Discharge: 2020-12-10 | Disposition: A | Discharge status: Home / Self Care | Payer: Medicare Other | Attending: Emergency Medicine | Admitting: Emergency Medicine

## 2020-12-10 DIAGNOSIS — R0789 Other chest pain: Secondary | ICD-10-CM | POA: Insufficient documentation

## 2020-12-10 DIAGNOSIS — R0602 Shortness of breath: Secondary | ICD-10-CM | POA: Diagnosis not present

## 2020-12-10 DIAGNOSIS — R42 Dizziness and giddiness: Secondary | ICD-10-CM | POA: Diagnosis not present

## 2020-12-10 LAB — COMPREHENSIVE METABOLIC PANEL
ALT: 20 U/L (ref 0–44)
AST: 25 U/L (ref 15–41)
Albumin: 4.2 g/dL (ref 3.5–5.0)
Alkaline Phosphatase: 60 U/L (ref 38–126)
Anion gap: 4 — ABNORMAL LOW (ref 5–15)
BUN: 14 mg/dL (ref 6–20)
CO2: 30 mmol/L (ref 22–32)
Calcium: 9.3 mg/dL (ref 8.9–10.3)
Chloride: 103 mmol/L (ref 98–111)
Creatinine, Ser: 0.91 mg/dL (ref 0.61–1.24)
GFR, Estimated: >60 mL/min (ref >60)
Glucose, Bld: 98 mg/dL (ref 70–99)
Potassium: 3.9 mmol/L (ref 3.5–5.1)
Sodium: 137 mmol/L (ref 135–145)
Total Bilirubin: 0.9 mg/dL (ref 0.3–1.2)
Total Protein: 6.8 g/dL (ref 6.5–8.1)

## 2020-12-10 LAB — TROPONIN I (HIGH SENSITIVITY)
Troponin I (High Sensitivity): 5 ng/L (ref <18)
Troponin I (High Sensitivity): 6 ng/L (ref <18)

## 2020-12-10 LAB — CBC WITH DIFFERENTIAL/PLATELET
Abs Immature Granulocytes: 0.01 10*3/uL (ref 0.00–0.07)
Basophils Absolute: 0 10*3/uL (ref 0.0–0.1)
Basophils Relative: 1 %
Eosinophils Absolute: 0.1 10*3/uL (ref 0.0–0.5)
Eosinophils Relative: 3 %
HCT: 39.2 % (ref 39.0–52.0)
Hemoglobin: 13.3 g/dL (ref 13.0–17.0)
Immature Granulocytes: 0 %
Lymphocytes Relative: 39 %
Lymphs Abs: 1.7 10*3/uL (ref 0.7–4.0)
MCH: 29.6 pg (ref 26.0–34.0)
MCHC: 33.9 g/dL (ref 30.0–36.0)
MCV: 87.1 fL (ref 80.0–100.0)
Monocytes Absolute: 0.4 10*3/uL (ref 0.1–1.0)
Monocytes Relative: 9 %
Neutro Abs: 2.1 10*3/uL (ref 1.7–7.7)
Neutrophils Relative %: 48 %
Platelets: 223 10*3/uL (ref 150–400)
RBC: 4.5 MIL/uL (ref 4.22–5.81)
RDW: 12.1 % (ref 11.5–15.5)
WBC: 4.4 10*3/uL (ref 4.0–10.5)
nRBC: 0 % (ref 0.0–0.2)

## 2020-12-10 LAB — LIPASE, BLOOD: Lipase: 42 U/L (ref 11–51)

## 2020-12-10 MED ORDER — ALUM & MAG HYDROXIDE-SIMETH 200-200-20 MG/5ML PO SUSP
30.0000 mL | Freq: Once | ORAL | Status: AC
  Administered 2020-12-10: 30 mL via ORAL
  Filled 2020-12-10: qty 30

## 2020-12-10 MED ORDER — LIDOCAINE VISCOUS HCL 2 % MT SOLN
15.0000 mL | Freq: Once | OROMUCOSAL | Status: AC
  Administered 2020-12-10: 15 mL via ORAL
  Filled 2020-12-10: qty 15

## 2020-12-10 NOTE — ED Provider Notes (Signed)
-----------------------------------------   7:06 AM on 12/10/2020 -----------------------------------------  Blood pressure 124/80, pulse 85, temperature 98.3 F (36.8 C), temperature source Oral, resp. rate 12, weight 104.3 kg, SpO2 99 %.  Assuming care from Dr. Elesa Massed.  In short, Dwayne Macdonald is a 40 y.o. male with a chief complaint of Chest Pain .  Refer to the original H&P for additional details.  The current plan of care is to follow-up repeat troponin, if negative patient would be appropriate for discharge home given reassuring workup for chest pain.  ----------------------------------------- 8:43 AM on 12/10/2020 ----------------------------------------- Repeat troponin within normal limits, on reassessment patient states that chest pain has resolved.  He is appropriate for discharge home with PCP follow-up, was counseled to return to the ED for new worsening symptoms.  Patient agrees with plan.    Chesley Noon, MD 12/10/20 [phone]

## 2020-12-10 NOTE — ED Triage Notes (Signed)
Pt arrived from home by [facility] with c/o sudden onset of left sided chest pain with pressure and squeezing sensation. Pt reports pain 10/10 and that pain woke him from his sleep. EMS reports pt has been seen recently for same and worked up with no diagnosis or findings. Pt A&O x4 on arrival. Pt received 324mg  Aspirin and 1 inch nitro paste to the left chest with no relief. SOB and diaphoresis reported by pt on onset of waking chest pain.

## 2020-12-10 NOTE — ED Provider Notes (Signed)
[facility] Emergency Department Provider Note  ____________________________________________   Event Date/Time   First MD Initiated Contact with Patient 12/10/20 [phone]     (approximate)  I have reviewed the triage vital signs and the nursing notes.   HISTORY  Chief Complaint Chest Pain    HPI Dwayne Macdonald is a 40 y.o. male with history of cognitive delay, GERD who presents to the emergency department with EMS for complaints of diffuse chest pressure, shortness of breath, dizziness that woke him from sleep tonight.  Patient has been seen many times in our ED and other ED's for the same.  He has had multiple negative work-ups and symptoms also improved with GI cocktail.  He denies any fevers, cough, nausea, vomiting, diaphoresis.  No history of PE, DVT, exogenous estrogen use, recent fractures, surgery, trauma, hospitalization, prolonged travel or other immobilization. No lower extremity swelling or pain. No calf tenderness.  Denies any aggravating or alleviating factors.  States he lives at home with his mother.  Given full dose ASA and inch of nitro paste with EMS with no change in symptoms.        Past Medical History:  Diagnosis Date   GERD (gastroesophageal reflux disease)    probable, based on symptoms and repeat ED visits    There are no problems to display for this patient.   History reviewed. No pertinent surgical history.  Prior to Admission medications   Medication Sig Start Date End Date Taking? Authorizing Provider  aluminum-magnesium hydroxide-simethicone (MAALOX) 200-200-20 MG/5ML SUSP Take 30 mLs by mouth 4 (four) times daily -  before meals and at bedtime. 05/05/20   Sharman Cheek, MD  pantoprazole (PROTONIX) 40 MG tablet Take 1 tablet (40 mg total) by mouth daily. 10/18/20 10/18/21  Faythe Ghee, PA-C    Allergies Patient has no known allergies.  Family History  Problem Relation Age of Onset   Healthy Mother    Healthy  Father     Social History Social History   Tobacco Use   Smoking status: Never   Smokeless tobacco: Never  Substance Use Topics   Alcohol use: No   Drug use: Never    Review of Systems Constitutional: No fever. Eyes: No visual changes. ENT: No sore throat. Cardiovascular: + chest pain. Respiratory: + shortness of breath. Gastrointestinal: No nausea, vomiting, diarrhea. Genitourinary: Negative for dysuria. Musculoskeletal: Negative for back pain. Skin: Negative for rash. Neurological: Negative for focal weakness or numbness.  ____________________________________________   PHYSICAL EXAM:  VITAL SIGNS: ED Triage Vitals [12/10/20 0531]  Enc Vitals Group     BP (!) 146/90     Pulse Rate 81     Resp 16     Temp 98.3 F (36.8 C)     Temp Source Oral     SpO2 98 %     Weight 230 lb (104.3 kg)     Height      Head Circumference      Peak Flow      Pain Score      Pain Loc      Pain Edu?      Excl. in GC?    CONSTITUTIONAL: Alert and oriented and responds appropriately to questions. Well-appearing; well-nourished HEAD: Normocephalic EYES: Conjunctivae clear, pupils appear equal, EOM appear intact ENT: normal nose; moist mucous membranes NECK: Supple, normal ROM CARD: RRR; S1 and S2 appreciated; no murmurs, no clicks, no rubs, no gallops CHEST:  Chest wall is nontender to palpation.  No crepitus, ecchymosis, erythema, warmth, rash or other lesions present.   RESP: Normal chest excursion without splinting or tachypnea; breath sounds clear and equal bilaterally; no wheezes, no rhonchi, no rales, no hypoxia or respiratory distress, speaking full sentences ABD/GI: Normal bowel sounds; non-distended; soft, non-tender, no rebound, no guarding, no peritoneal signs, no hepatosplenomegaly BACK: The back appears normal EXT: Normal ROM in all joints; no deformity noted, no edema; no cyanosis, no calf tenderness or calf swelling SKIN: Normal color for age and race; warm; no rash  on exposed skin NEURO: Moves all extremities equally PSYCH: The patient's mood and manner are appropriate.  ____________________________________________   LABS (all labs ordered are listed, but only abnormal results are displayed)  Labs Reviewed  COMPREHENSIVE METABOLIC PANEL - Abnormal; Notable for the following components:      Result Value   Anion gap 4 (*)    All other components within normal limits  CBC WITH DIFFERENTIAL/PLATELET  LIPASE, BLOOD  TROPONIN I (HIGH SENSITIVITY)   ____________________________________________  EKG   EKG Interpretation  Date/Time:  Thursday December 10 2020 05:25:45 EDT Ventricular Rate:  81 PR Interval:  145 QRS Duration: 99 QT Interval:  375 QTC Calculation: 436 R Axis:   61 Text Interpretation: Sinus rhythm RSR' in V1 or V2, right VCD or RVH Confirmed by Rochele Raring [phone] on 12/10/2020 5:32:50 AM        ____________________________________________  RADIOLOGY Normajean Baxter Teana Lindahl, personally viewed and evaluated these images (plain radiographs) as part of my medical decision making, as well as reviewing the written report by the radiologist.  ED MD interpretation: Chest x-ray clear.  Official radiology report(s): DG Chest Portable 1 View  Result Date: 12/10/2020 CLINICAL DATA:  Chest pain and shortness of breath EXAM: PORTABLE CHEST 1 VIEW COMPARISON:  05/03/2020 FINDINGS: Normal heart size and mediastinal contours. No acute infiltrate or edema. No effusion or pneumothorax. No acute osseous findings. IMPRESSION: No active disease. Electronically Signed   By: Tiburcio Pea M.D.   On: 12/10/2020 05:49    ____________________________________________   PROCEDURES  Procedure(s) performed (including Critical Care):  Procedures    ____________________________________________   INITIAL IMPRESSION / ASSESSMENT AND PLAN / ED COURSE  As part of my medical decision making, I reviewed the following data within the electronic  MEDICAL RECORD NUMBER Nursing notes reviewed and incorporated, Labs reviewed , EKG interpreted , Old EKG reviewed, Old chart reviewed, Patient signed out to oncoming ED physician, Radiograph reviewed , and Notes from prior ED visits         Patient here with atypical chest pain.  Low suspicion for ACS.  He is PERC negative.  Doubt dissection.  EKG shows no new ischemic change.  Patient is seen frequently in the ED for the same.  Often thought to be GI in nature.  Abdominal exam is benign and he has had previous CT scans of his abdomen pelvis that have not shown any acute abnormality.  Will give GI cocktail.  Will obtain cardiac labs, chest x-ray.  ED PROGRESS  6:00 AM  Pt's labs are unremarkable.  Normal blood counts, electrolytes, renal function, LFTs and lipase.  First troponin normal.  Second pending and will be drawn at 7:30 AM.  Patient resting comfortably.  Patient will be signed out the oncoming ED physician to follow-up on repeat troponin results.  If negative, will discharge home with PCP follow-up.   I reviewed all nursing notes and pertinent previous records as available.  I have reviewed and  interpreted any EKGs, lab and urine results, imaging (as available).  ____________________________________________   FINAL CLINICAL IMPRESSION(S) / ED DIAGNOSES  Final diagnoses:  Atypical chest pain     ED Discharge Orders     None       *Please note:  CARNEY SAXTON was evaluated in Emergency Department on 12/10/2020 for the symptoms described in the history of present illness. He was evaluated in the context of the global COVID-19 pandemic, which necessitated consideration that the patient might be at risk for infection with the SARS-CoV-2 virus that causes COVID-19. Institutional protocols and algorithms that pertain to the evaluation of patients at risk for COVID-19 are in a state of rapid change based on information released by regulatory bodies including the CDC and federal and  state organizations. These policies and algorithms were followed during the patient's care in the ED.  Some ED evaluations and interventions may be delayed as a result of limited staffing during and the pandemic.*   Note:  This document was prepared using Dragon voice recognition software and may include unintentional dictation errors.    Damico Partin, Layla Maw, DO 12/10/20 [phone]

## 2020-12-10 NOTE — Discharge Instructions (Addendum)
Your EKG, labs and chest x-ray today were normal and reassuring.  Please follow-up with your primary care doctor.

## 2020-12-30 ENCOUNTER — Encounter: Payer: Self-pay | Admitting: Emergency Medicine

## 2020-12-30 ENCOUNTER — Emergency Department
Admission: EM | Admit: 2020-12-30 | Discharge: 2020-12-30 | Disposition: A | Discharge status: Home / Self Care | Payer: Medicare Other | Attending: Emergency Medicine | Admitting: Emergency Medicine

## 2020-12-30 ENCOUNTER — Other Ambulatory Visit: Payer: Self-pay

## 2020-12-30 DIAGNOSIS — R6884 Jaw pain: Secondary | ICD-10-CM | POA: Insufficient documentation

## 2020-12-30 DIAGNOSIS — Z5321 Procedure and treatment not carried out due to patient leaving prior to being seen by health care provider: Secondary | ICD-10-CM | POA: Diagnosis not present

## 2020-12-30 LAB — CBC WITH DIFFERENTIAL/PLATELET
Abs Immature Granulocytes: 0 10*3/uL (ref 0.00–0.07)
Basophils Absolute: 0 10*3/uL (ref 0.0–0.1)
Basophils Relative: 1 %
Eosinophils Absolute: 0.2 10*3/uL (ref 0.0–0.5)
Eosinophils Relative: 4 %
HCT: 36.6 % — ABNORMAL LOW (ref 39.0–52.0)
Hemoglobin: 12.8 g/dL — ABNORMAL LOW (ref 13.0–17.0)
Immature Granulocytes: 0 %
Lymphocytes Relative: 31 %
Lymphs Abs: 1.4 10*3/uL (ref 0.7–4.0)
MCH: 30.3 pg (ref 26.0–34.0)
MCHC: 35 g/dL (ref 30.0–36.0)
MCV: 86.7 fL (ref 80.0–100.0)
Monocytes Absolute: 0.4 10*3/uL (ref 0.1–1.0)
Monocytes Relative: 9 %
Neutro Abs: 2.5 10*3/uL (ref 1.7–7.7)
Neutrophils Relative %: 55 %
Platelets: 211 10*3/uL (ref 150–400)
RBC: 4.22 MIL/uL (ref 4.22–5.81)
RDW: 12.3 % (ref 11.5–15.5)
WBC: 4.5 10*3/uL (ref 4.0–10.5)
nRBC: 0 % (ref 0.0–0.2)

## 2020-12-30 LAB — BASIC METABOLIC PANEL
Anion gap: 6 (ref 5–15)
BUN: 8 mg/dL (ref 6–20)
CO2: 28 mmol/L (ref 22–32)
Calcium: 9.3 mg/dL (ref 8.9–10.3)
Chloride: 105 mmol/L (ref 98–111)
Creatinine, Ser: 0.74 mg/dL (ref 0.61–1.24)
GFR, Estimated: >60 mL/min (ref >60)
Glucose, Bld: 115 mg/dL — ABNORMAL HIGH (ref 70–99)
Potassium: 3.3 mmol/L — ABNORMAL LOW (ref 3.5–5.1)
Sodium: 139 mmol/L (ref 135–145)

## 2020-12-30 NOTE — ED Triage Notes (Signed)
Pt to triage via w/c with no distress noted, brought in by EMS; pt c/o pain/swelling to left side of jaw this morning; pt denies any recent illness

## 2020-12-30 NOTE — ED Provider Notes (Signed)
Emergency Medicine Provider Triage Evaluation Note  Dwayne Macdonald , a 40 y.o. male  was evaluated in triage.  Pt complains of left jaw pain/swelling.  Review of Systems  Positive: Left jaw swelling Negative: Fever  Physical Exam  BP (!) 143/89 (BP Location: Right Arm)   Pulse 79   Temp 98.2 F (36.8 C) (Oral)   Resp 18   Ht 5\' 9"  (1.753 m)   Wt 101.2 kg   SpO2 97%   BMI 32.93 kg/m  Gen:   Awake, no distress   Resp:  Normal effort  MSK:   Moves extremities without difficulty  Other:  Left anterior cervical lymphadenopathy  Medical Decision Making  Medically screening exam initiated at 5:46 AM.  Appropriate orders placed.  was informed that the remainder of the evaluation will be completed by another provider, this initial triage assessment does not replace that evaluation, and the importance of remaining in the ED until their evaluation is complete.  40 year old male presenting with left jaw swelling.  Differential diagnosis includes but is not limited to dental abscess, peritonsillar abscess, salivary stone obstruction, parotitis, etc.  Will obtain basic lab work while patient is pending a treatment room.   46, MD 12/30/20 [phone]

## 2021-02-10 ENCOUNTER — Other Ambulatory Visit: Payer: Self-pay

## 2021-02-10 ENCOUNTER — Emergency Department
Admission: EM | Admit: 2021-02-10 | Discharge: 2021-02-10 | Disposition: A | Discharge status: Home / Self Care | Payer: Medicare Other | Attending: Emergency Medicine | Admitting: Emergency Medicine

## 2021-02-10 DIAGNOSIS — Z20822 Contact with and (suspected) exposure to covid-19: Secondary | ICD-10-CM | POA: Insufficient documentation

## 2021-02-10 DIAGNOSIS — J039 Acute tonsillitis, unspecified: Secondary | ICD-10-CM | POA: Diagnosis not present

## 2021-02-10 DIAGNOSIS — J029 Acute pharyngitis, unspecified: Secondary | ICD-10-CM | POA: Diagnosis present

## 2021-02-10 LAB — RESP PANEL BY RT-PCR (FLU A&B, COVID) ARPGX2
Influenza A by PCR: NEGATIVE
Influenza B by PCR: NEGATIVE
SARS Coronavirus 2 by RT PCR: NEGATIVE

## 2021-02-10 LAB — GROUP A STREP BY PCR: Group A Strep by PCR: NOT DETECTED

## 2021-02-10 MED ORDER — AMOXICILLIN 875 MG PO TABS: 875.0000 mg | ORAL_TABLET | Freq: Two times a day (BID) | ORAL | 0 refills | Status: DC

## 2021-02-10 NOTE — ED Notes (Signed)
Pt to ED for sore throat since last night. Pt came EMS. Pt denies other problems.  Upon further questioning, pt also complains of lower back and side pain since "a few weeks". Pt is poor historian.

## 2021-02-10 NOTE — ED Provider Notes (Signed)
[facility] Emergency Department Provider Note  ____________________________________________   Event Date/Time   First MD Initiated Contact with Patient 02/10/21 1335     (approximate)  I have reviewed the triage vital signs and the nursing notes.   HISTORY  Chief Complaint Sore Throat    HPI Dwayne Macdonald is a 40 y.o. male presents emergency department via EMS for sore throat that started last night.  No fever or chills.  No chest pain/shortness of breath.  No vomiting or diarrhea  Past Medical History:  Diagnosis Date   GERD (gastroesophageal reflux disease)    probable, based on symptoms and repeat ED visits    There are no problems to display for this patient.   History reviewed. No pertinent surgical history.  Prior to Admission medications   Medication Sig Start Date End Date Taking? Authorizing Provider  aluminum-magnesium hydroxide-simethicone (MAALOX) 200-200-20 MG/5ML SUSP Take 30 mLs by mouth 4 (four) times daily -  before meals and at bedtime. 05/05/20   Sharman Cheek, MD  amoxicillin (AMOXIL) 875 MG tablet Take 1 tablet (875 mg total) by mouth 2 (two) times daily. 02/10/21   Ahyana Skillin, Roselyn Bering, PA-C  pantoprazole (PROTONIX) 40 MG tablet Take 1 tablet (40 mg total) by mouth daily. 10/18/20 10/18/21  Faythe Ghee, PA-C    Allergies Patient has no known allergies.  Family History  Problem Relation Age of Onset   Healthy Mother    Healthy Father     Social History Social History   Tobacco Use   Smoking status: Never   Smokeless tobacco: Never  Vaping Use   Vaping Use: Never used  Substance Use Topics   Alcohol use: No   Drug use: Never    Review of Systems  Constitutional: No fever/chills Eyes: No visual changes. ENT: Positive sore throat. Respiratory: Denies cough Cardiovascular: Denies chest pain Gastrointestinal: Denies abdominal pain Genitourinary: Negative for dysuria. Musculoskeletal: Negative for back  pain. Skin: Negative for rash. Psychiatric: no mood changes,     ____________________________________________   PHYSICAL EXAM:  VITAL SIGNS: ED Triage Vitals  Enc Vitals Group     BP 02/10/21 1241 127/80     Pulse Rate 02/10/21 1241 80     Resp 02/10/21 1241 18     Temp 02/10/21 1241 98.3 F (36.8 C)     Temp Source 02/10/21 1241 Oral     SpO2 02/10/21 1241 92 %     Weight --      Height --      Head Circumference --      Peak Flow --      Pain Score 02/10/21 1239 9     Pain Loc --      Pain Edu? --      Excl. in GC? --     Constitutional: Alert and oriented. Well appearing and in no acute distress. Eyes: Conjunctivae are normal.  Head: Atraumatic. Nose: No congestion/rhinnorhea. Mouth/Throat: Mucous membranes are moist.   Neck:  supple no lymphadenopathy noted Cardiovascular: Normal rate, regular rhythm. Heart sounds are normal Respiratory: Normal respiratory effort.  No retractions, lungs c t a  GU: deferred Musculoskeletal: FROM all extremities, warm and well perfused Neurologic:  Normal speech and language.  Skin:  Skin is warm, dry and intact. No rash noted. Psychiatric: Mood and affect are normal. Speech and behavior are normal.  ____________________________________________   LABS (all labs ordered are listed, but only abnormal results are displayed)  Labs Reviewed  RESP  PANEL BY RT-PCR (FLU A&B, COVID) ARPGX2  GROUP A STREP BY PCR   ____________________________________________   ____________________________________________  RADIOLOGY    ____________________________________________   PROCEDURES  Procedure(s) performed: No  Procedures    ____________________________________________   INITIAL IMPRESSION / ASSESSMENT AND PLAN / ED COURSE  Pertinent labs & imaging results that were available during my care of the patient were reviewed by me and considered in my medical decision making (see chart for details).   Patient is 40 year old  male presents emergency department with sore throat.  See HPI.  Physical exam shows patient be stable.  Tonsils are swollen no exudate is noted.  I did explain the findings to the patient.  Given prescription for amoxicillin.  Of note the patient did not mention his side or back pain to me.  I did ask him if he had any other concerns.  He is discharged in stable condition.  Instructions to take over-the-counter medications as needed.  Return if worsening.     Dwayne Macdonald was evaluated in Emergency Department on 02/10/2021 for the symptoms described in the history of present illness. He was evaluated in the context of the global COVID-19 pandemic, which necessitated consideration that the patient might be at risk for infection with the SARS-CoV-2 virus that causes COVID-19. Institutional protocols and algorithms that pertain to the evaluation of patients at risk for COVID-19 are in a state of rapid change based on information released by regulatory bodies including the CDC and federal and state organizations. These policies and algorithms were followed during the patient's care in the ED.    As part of my medical decision making, I reviewed the following data within the electronic MEDICAL RECORD NUMBER Nursing notes reviewed and incorporated, Labs reviewed , Old chart reviewed, Notes from prior ED visits, and [place] Controlled Substance Database  ____________________________________________   FINAL CLINICAL IMPRESSION(S) / ED DIAGNOSES  Final diagnoses:  Acute tonsillitis, unspecified etiology      NEW MEDICATIONS STARTED DURING THIS VISIT:  Current Discharge Medication List     START taking these medications   Details  amoxicillin (AMOXIL) 875 MG tablet Take 1 tablet (875 mg total) by mouth 2 (two) times daily. Qty: 20 tablet, Refills: 0         Note:  This document was prepared using Dragon voice recognition software and may include unintentional dictation errors.    Faythe Ghee, PA-C 02/10/21 1352    Minna Antis, MD 02/10/21 1355

## 2021-02-10 NOTE — ED Triage Notes (Signed)
Pt comes with c/o sore throat that started last night. Pt states he feels like his tonsils are swollen.

## 2021-02-10 NOTE — Discharge Instructions (Signed)
Gargle with warm salt water.  Take medication as prescribed.  Return emergency department worsening.

## 2021-03-15 ENCOUNTER — Emergency Department
Admission: EM | Admit: 2021-03-15 | Discharge: 2021-03-15 | Disposition: A | Discharge status: Home / Self Care | Payer: Medicare Other | Attending: Emergency Medicine | Admitting: Emergency Medicine

## 2021-03-15 ENCOUNTER — Emergency Department: Payer: Medicare Other

## 2021-03-15 ENCOUNTER — Other Ambulatory Visit: Payer: Self-pay

## 2021-03-15 DIAGNOSIS — R079 Chest pain, unspecified: Secondary | ICD-10-CM

## 2021-03-15 DIAGNOSIS — E876 Hypokalemia: Secondary | ICD-10-CM | POA: Insufficient documentation

## 2021-03-15 DIAGNOSIS — I10 Essential (primary) hypertension: Secondary | ICD-10-CM | POA: Insufficient documentation

## 2021-03-15 LAB — CBC
HCT: 36.3 % — ABNORMAL LOW (ref 39.0–52.0)
Hemoglobin: 12.5 g/dL — ABNORMAL LOW (ref 13.0–17.0)
MCH: 29.8 pg (ref 26.0–34.0)
MCHC: 34.4 g/dL (ref 30.0–36.0)
MCV: 86.4 fL (ref 80.0–100.0)
Platelets: 197 10*3/uL (ref 150–400)
RBC: 4.2 MIL/uL — ABNORMAL LOW (ref 4.22–5.81)
RDW: 12.2 % (ref 11.5–15.5)
WBC: 4.2 10*3/uL (ref 4.0–10.5)
nRBC: 0 % (ref 0.0–0.2)

## 2021-03-15 LAB — BASIC METABOLIC PANEL
Anion gap: 5 (ref 5–15)
BUN: 12 mg/dL (ref 6–20)
CO2: 29 mmol/L (ref 22–32)
Calcium: 9.4 mg/dL (ref 8.9–10.3)
Chloride: 103 mmol/L (ref 98–111)
Creatinine, Ser: 0.88 mg/dL (ref 0.61–1.24)
GFR, Estimated: >60 mL/min (ref >60)
Glucose, Bld: 94 mg/dL (ref 70–99)
Potassium: 3 mmol/L — ABNORMAL LOW (ref 3.5–5.1)
Sodium: 137 mmol/L (ref 135–145)

## 2021-03-15 LAB — MAGNESIUM: Magnesium: 2.3 mg/dL (ref 1.7–2.4)

## 2021-03-15 LAB — CBG MONITORING, ED: Glucose-Capillary: 96 mg/dL (ref 70–99)

## 2021-03-15 LAB — TROPONIN I (HIGH SENSITIVITY): Troponin I (High Sensitivity): 6 ng/L (ref <18)

## 2021-03-15 MED ORDER — PANTOPRAZOLE SODIUM 40 MG PO TBEC: 40.0000 mg | DELAYED_RELEASE_TABLET | Freq: Every day | ORAL | 0 refills | Status: DC

## 2021-03-15 MED ORDER — SUCRALFATE 1 G PO TABS
1.0000 g | ORAL_TABLET | Freq: Once | ORAL | Status: AC
  Administered 2021-03-15: 1 g via ORAL
  Filled 2021-03-15: qty 1

## 2021-03-15 MED ORDER — POTASSIUM CHLORIDE CRYS ER 20 MEQ PO TBCR
40.0000 meq | EXTENDED_RELEASE_TABLET | Freq: Once | ORAL | Status: AC
  Administered 2021-03-15: 40 meq via ORAL
  Filled 2021-03-15: qty 2

## 2021-03-15 MED ORDER — PANTOPRAZOLE SODIUM 40 MG PO TBEC
40.0000 mg | DELAYED_RELEASE_TABLET | Freq: Once | ORAL | Status: AC
Start: 2021-03-15 — End: 2021-03-15
  Administered 2021-03-15: 40 mg via ORAL
  Filled 2021-03-15: qty 1

## 2021-03-15 MED ORDER — ALUM & MAG HYDROXIDE-SIMETH 200-200-20 MG/5ML PO SUSP
30.0000 mL | Freq: Once | ORAL | Status: AC
  Administered 2021-03-15: 30 mL via ORAL
  Filled 2021-03-15: qty 30

## 2021-03-15 NOTE — ED Triage Notes (Signed)
Pt arrives via [facility] with CC of L sided chest pain that has been "going on for months and got worse tonight." Denies nausea, shortness of breath, and dizziness. Reports pain goes up into his throat when he burps.

## 2021-03-15 NOTE — ED Provider Notes (Signed)
[facility] Provider Note    Event Date/Time   First MD Initiated Contact with Patient 03/15/21 0149     (approximate)   History   Chest Pain   HPI  Dwayne Macdonald is a 41 y.o. male with past medical history of GERD who presents via EMS from home for assessment of some left-sided chest pain.  Patient states she has been experiencing it for most of the day thinks it got a little worse after he ate a hamburger earlier this evening.  He states it is the same pain he has been dealing with on and off for several months.  It is not any different today than it has been over the last couple months.  He denies any fevers, cough, shortness of breath, back pain, headache, earache, abdominal pain, vomiting, diarrhea, urinary symptoms, rash or extremity pain.  He denies any illicit drug use, tobacco abuse or EtOH use.  Denies currently taking any antiacids.  No other acute concerns at this time      Physical Exam  Triage Vital Signs: ED Triage Vitals  Enc Vitals Group     BP      Pulse      Resp      Temp      Temp src      SpO2      Weight      Height      Head Circumference      Peak Flow      Pain Score      Pain Loc      Pain Edu?      Excl. in GC?     Most recent vital signs: Vitals:   03/15/21 0300 03/15/21 0330  BP:  (!) 147/101  Pulse: 70 77  Resp: 14 18  Temp:    SpO2: 99% 98%    General: Awake, no distress.  CV:  Good peripheral perfusion.  No murmurs rubs or gallops.  2+ bilateral radial pulses. Resp:  Normal effort.  Clear bilaterally. Abd:  No distention.  Soft throughout. Other:  No significant lower extremity edema.   ED Results / Procedures / Treatments  Labs (all labs ordered are listed, but only abnormal results are displayed) Labs Reviewed  BASIC METABOLIC PANEL - Abnormal; Notable for the following components:      Result Value   Potassium 3.0 (*)    All other components within normal limits  CBC - Abnormal; Notable  for the following components:   RBC 4.20 (*)    Hemoglobin 12.5 (*)    HCT 36.3 (*)    All other components within normal limits  MAGNESIUM  CBG MONITORING, ED  TROPONIN I (HIGH SENSITIVITY)  TROPONIN I (HIGH SENSITIVITY)     EKG  EKG is remarkable for sinus rhythm with a ventricular rate of 71, normal axis, unremarkable intervals without clear evidence of acute ischemia although is nonspecific change in aVL.  RADIOLOGY  Chest x-ray ordered and reviewed by myself shows no focal consolidation, effusion, edema, pneumothorax or other clear acute thoracic process.  I have reviewed radiology's interpretation and agree with the findings.     PROCEDURES:  Critical Care performed: No  .1-3 Lead EKG Interpretation Performed by: Gilles Chiquito, MD Authorized by: Gilles Chiquito, MD     Interpretation: normal     ECG rate assessment: normal     Rhythm: sinus rhythm     Ectopy: none     Conduction:  normal    The patient is on the cardiac monitor to evaluate for evidence of arrhythmia and/or significant heart rate changes.   MEDICATIONS ORDERED IN ED: Medications  potassium chloride SA (KLOR-CON M) CR tablet 40 mEq (40 mEq Oral Given 03/15/21 0243)  alum & mag hydroxide-simeth (MAALOX/MYLANTA) 200-200-20 MG/5ML suspension 30 mL (30 mLs Oral Given 03/15/21 0242)  sucralfate (CARAFATE) tablet 1 g (1 g Oral Given 03/15/21 0243)  pantoprazole (PROTONIX) EC tablet 40 mg (40 mg Oral Given 03/15/21 0242)     IMPRESSION / MDM / ASSESSMENT AND PLAN / ED COURSE  I reviewed the triage vital signs and the nursing notes.                              Differential diagnosis includes, but is not limited to, ACS, costochondritis, pneumonia, pneumothorax, bronchitis, GERD, dissection and PE.  Overall given absence of any tachycardia or tachypnea or any associated shortness of breath or clear risk factors and patient being PERC negative at this time I have a low suspicion for PE.  Overall  description of symptoms and chronicity is not consistent with a dissection.  Patient is still hypertensive putting him at risk for ACS as well.   EKG is remarkable for sinus rhythm with a ventricular rate of 71, normal axis, unremarkable intervals without clear evidence of acute ischemia although is nonspecific change in aVL.  Given nonelevated troponin obtained greater than 3 hours after symptom onset I have low suspicion for ACS  Chest x-ray ordered and reviewed by myself shows no focal consolidation, effusion, edema, pneumothorax or other clear acute thoracic process.  I have reviewed radiology's interpretation and agree with the findings.   BMP remarkable for potassium of 3.  This was repleted.  Magnesium is within normal limits.  CBC is unremarkable.  Patient given GI cocktail on reassessment stated he was feeling much better without any chest pain.  Overall I have low suspicion  for immediately life-threatening process.  Explained to patient that it is reasonable to trial an antacids and so Rx for Protonix was written.  However he must follow-up with his PCP to have his blood pressure rechecked and he may still have coronary artery disease and may need a stress test but does not need to be admitted at this time as I think he is stable and have a low suspicion for acute ACS.  Discharged in stable condition.  Strict return precautions advised and discussed.      FINAL CLINICAL IMPRESSION(S) / ED DIAGNOSES   Final diagnoses:  Chest pain, unspecified type  Hypertension, unspecified type  Hypokalemia     Rx / DC Orders   ED Discharge Orders          Ordered    pantoprazole (PROTONIX) 40 MG tablet  Daily        03/15/21 0343             Note:  This document was prepared using Dragon voice recognition software and may include unintentional dictation errors.   Gilles Chiquito, MD 03/15/21 [phone]

## 2021-04-18 ENCOUNTER — Emergency Department
Admission: EM | Admit: 2021-04-18 | Discharge: 2021-04-18 | Disposition: A | Discharge status: Home / Self Care | Payer: Medicare Other | Attending: Emergency Medicine | Admitting: Emergency Medicine

## 2021-04-18 ENCOUNTER — Other Ambulatory Visit: Payer: Self-pay

## 2021-04-18 ENCOUNTER — Emergency Department: Payer: Medicare Other

## 2021-04-18 DIAGNOSIS — R079 Chest pain, unspecified: Secondary | ICD-10-CM | POA: Diagnosis present

## 2021-04-18 DIAGNOSIS — K2100 Gastro-esophageal reflux disease with esophagitis, without bleeding: Secondary | ICD-10-CM | POA: Diagnosis not present

## 2021-04-18 LAB — BASIC METABOLIC PANEL
Anion gap: 5 (ref 5–15)
BUN: 15 mg/dL (ref 6–20)
CO2: 29 mmol/L (ref 22–32)
Calcium: 9.2 mg/dL (ref 8.9–10.3)
Chloride: 102 mmol/L (ref 98–111)
Creatinine, Ser: 1.26 mg/dL — ABNORMAL HIGH (ref 0.61–1.24)
GFR, Estimated: >60 mL/min (ref >60)
Glucose, Bld: 88 mg/dL (ref 70–99)
Potassium: 3.3 mmol/L — ABNORMAL LOW (ref 3.5–5.1)
Sodium: 136 mmol/L (ref 135–145)

## 2021-04-18 LAB — CBC
HCT: 38.9 % — ABNORMAL LOW (ref 39.0–52.0)
Hemoglobin: 12.9 g/dL — ABNORMAL LOW (ref 13.0–17.0)
MCH: 28.7 pg (ref 26.0–34.0)
MCHC: 33.2 g/dL (ref 30.0–36.0)
MCV: 86.4 fL (ref 80.0–100.0)
Platelets: 214 10*3/uL (ref 150–400)
RBC: 4.5 MIL/uL (ref 4.22–5.81)
RDW: 11.9 % (ref 11.5–15.5)
WBC: 3.4 10*3/uL — ABNORMAL LOW (ref 4.0–10.5)
nRBC: 0 % (ref 0.0–0.2)

## 2021-04-18 LAB — TROPONIN I (HIGH SENSITIVITY): Troponin I (High Sensitivity): 5 ng/L (ref <18)

## 2021-04-18 MED ORDER — ALUM & MAG HYDROXIDE-SIMETH 200-200-20 MG/5ML PO SUSP
30.0000 mL | Freq: Once | ORAL | Status: AC
  Administered 2021-04-18: 30 mL via ORAL
  Filled 2021-04-18: qty 30

## 2021-04-18 MED ORDER — SODIUM CHLORIDE 0.9 % IV BOLUS
500.0000 mL | Freq: Once | INTRAVENOUS | Status: AC
  Administered 2021-04-18: 500 mL via INTRAVENOUS

## 2021-04-18 MED ORDER — LIDOCAINE VISCOUS HCL 2 % MT SOLN
15.0000 mL | Freq: Once | OROMUCOSAL | Status: AC
  Administered 2021-04-18: 15 mL via ORAL
  Filled 2021-04-18: qty 15

## 2021-04-18 MED ORDER — PANTOPRAZOLE SODIUM 40 MG PO TBEC: 40.0000 mg | DELAYED_RELEASE_TABLET | Freq: Every day | ORAL | 0 refills | Status: AC

## 2021-04-18 MED ORDER — ASPIRIN 81 MG PO CHEW
324.0000 mg | CHEWABLE_TABLET | Freq: Once | ORAL | Status: AC
  Administered 2021-04-18: 324 mg via ORAL
  Filled 2021-04-18: qty 4

## 2021-04-18 NOTE — Discharge Instructions (Signed)
Please seek medical attention for any high fevers, chest pain, shortness of breath, change in behavior, persistent vomiting, bloody stool or any other new or concerning symptoms.  

## 2021-04-18 NOTE — ED Provider Notes (Signed)
[facility] Provider Note    Event Date/Time   First MD Initiated Contact with Patient 04/18/21 0203     (approximate)   History   Chest Pain   HPI  Dwayne Macdonald is a 41 y.o. male  who, per clinic office note dated 07/06/2020 had been on prilosec and carafate, presents to the emergency department today for chest pain. He states that it has been going on for a while. Located in the center part of his chest he describes it as a swirling sensation. The patient then felt the sensation go up to his throat. It was relieved after he burped. The patient did not have any associated shortness of breath. No nausea or vomiting. Says he has been seen for this in the past and has been put on medication for it in the past.    Physical Exam   Triage Vital Signs: ED Triage Vitals  Enc Vitals Group     BP 04/18/21 0151 134/81     Pulse Rate 04/18/21 0151 71     Resp 04/18/21 0151 18     Temp 04/18/21 0151 98.2 F (36.8 C)     Temp Source 04/18/21 0151 Oral     SpO2 04/18/21 0151 97 %     Weight 04/18/21 0159 233 lb (105.7 kg)     Height 04/18/21 0159 5\' 9"  (1.753 m)     Head Circumference --      Peak Flow --      Pain Score 04/18/21 0159 4                Most recent vital signs: Vitals:   04/18/21 0230 04/18/21 0330  BP: (!) 146/90 127/86  Pulse: 72 71  Resp: 11 15  Temp:    SpO2: 100% 97%    General: Awake, no distress.  CV:  Good peripheral perfusion. No murmurs.  Resp:  Normal effort.  Abd:  No distention.     ED Results / Procedures / Treatments   Labs (all labs ordered are listed, but only abnormal results are displayed) Labs Reviewed  BASIC METABOLIC PANEL - Abnormal; Notable for the following components:      Result Value   Potassium 3.3 (*)    Creatinine, Ser 1.26 (*)    All other components within normal limits  CBC - Abnormal; Notable for the following components:   WBC 3.4 (*)    Hemoglobin 12.9 (*)    HCT 38.9 (*)    All  other components within normal limits  CBG MONITORING, ED  TROPONIN I (HIGH SENSITIVITY)  TROPONIN I (HIGH SENSITIVITY)     EKG  I, 04/20/21, attending physician, personally viewed and interpreted this EKG  EKG Time: 0150 Rate: 68 Rhythm: sinus rhythm Axis: normal Intervals: qtc 423 QRS: narrow ST changes: j point elevation v2 Impression: no st elevation mi. No arrhythmia.     RADIOLOGY CXR I independently interpreted and visualized the cxr. My interpretation: No pneumothorax, no pneumonia. Radiology interpretation:    IMPRESSION:  No active disease.      PROCEDURES:  Critical Care performed: No  Procedures   MEDICATIONS ORDERED IN ED: Medications  aspirin chewable tablet 324 mg (324 mg Oral Given 04/18/21 0334)  alum & mag hydroxide-simeth (MAALOX/MYLANTA) 200-200-20 MG/5ML suspension 30 mL (30 mLs Oral Given 04/18/21 0335)    And  lidocaine (XYLOCAINE) 2 % viscous mouth solution 15 mL (15 mLs Oral Given 04/18/21 0335)  sodium chloride 0.9 %  bolus 500 mL (500 mLs Intravenous New Bag/Given 04/18/21 0338)     IMPRESSION / MDM / ASSESSMENT AND PLAN / ED COURSE  I reviewed the triage vital signs and the nursing notes.                              Differential diagnosis includes, but is not limited to, pneumonia, pneumothorax, gerd  Patient presented to the emergency department today because of concerns for chest pain.  Patient has been evaluated in the emergency department for this in the past.  Troponin negative today.  Chest x-ray without any concerning findings.  At this time I do think GERD likely.  Discussed this with the patient.  Patient did feel improvement during his stay here in the emergency department.  While patient did present with chest pain and admission was considered given improvement and reassuring work-up at this time I do not think inpatient admission necessary.  FINAL CLINICAL IMPRESSION(S) / ED DIAGNOSES   Final diagnoses:   Nonspecific chest pain  Gastroesophageal reflux disease with esophagitis, unspecified whether hemorrhage     Note:  This document was prepared using Dragon voice recognition software and may include unintentional dictation errors.    Phineas Semen, MD 04/18/21 [phone]

## 2021-07-31 ENCOUNTER — Other Ambulatory Visit: Payer: Self-pay

## 2021-07-31 ENCOUNTER — Emergency Department
Admission: EM | Admit: 2021-07-31 | Discharge: 2021-07-31 | Disposition: A | Discharge status: Home / Self Care | Payer: Medicare Other | Attending: Emergency Medicine | Admitting: Emergency Medicine

## 2021-07-31 DIAGNOSIS — L729 Follicular cyst of the skin and subcutaneous tissue, unspecified: Secondary | ICD-10-CM | POA: Diagnosis not present

## 2021-07-31 DIAGNOSIS — R1900 Intra-abdominal and pelvic swelling, mass and lump, unspecified site: Secondary | ICD-10-CM | POA: Diagnosis present

## 2021-07-31 MED ORDER — SULFAMETHOXAZOLE-TRIMETHOPRIM 800-160 MG PO TABS: 2.0000 | ORAL_TABLET | Freq: Two times a day (BID) | ORAL | 0 refills | Status: AC

## 2021-07-31 NOTE — ED Triage Notes (Signed)
Pt presents to ER c/o abscess on his abdomen that he first noticed a few days ago.  Pt states area was a little painful tonight, and states he was able to squeeze out some drainage before he called ems.  Area and normal in color and temperature, but has area of hardness around abscess.  Pt is A&O x4 at this time in NAD.

## 2021-07-31 NOTE — ED Provider Notes (Signed)
[facility] Provider Note    Event Date/Time   First MD Initiated Contact with Patient 07/31/21 0406     (approximate)   History   Abscess   HPI  Dwayne Macdonald is a 41 y.o. male who has a history of frequent visits to the emergency department.  He presents tonight for evaluation of a bump on his anterior abdominal wall.  He said that he noticed it just recently and squeezed it tonight and some white liquid came out.  It hurts a little bit.  He denies fever, chest pain, and shortness of breath.  He has no recent injury and cannot think of any bug bite or other exposure.      Physical Exam   Triage Vital Signs: ED Triage Vitals  Enc Vitals Group     BP 07/31/21 0034 140/86     Pulse Rate 07/31/21 0034 68     Resp 07/31/21 0034 16     Temp 07/31/21 0034 97.9 F (36.6 C)     Temp Source 07/31/21 0034 Oral     SpO2 07/31/21 0034 96 %     Weight 07/31/21 0039 106.6 kg (235 lb)     Height 07/31/21 0039 1.753 m (5\' 9" )     Head Circumference --      Peak Flow --      Pain Score 07/31/21 0039 8     Pain Loc --      Pain Edu? --      Excl. in GC? --     Most recent vital signs: Vitals:   07/31/21 0530 07/31/21 0700  BP: (!) 153/100 (!) 142/105  Pulse: 63 69  Resp: 18   Temp:    SpO2: 100% 100%     General: Awake, no distress.  CV:  Good peripheral perfusion.  Resp:  Normal effort.  Abd:  No distention.  Other:  The patient has an area of induration on the left side of the middle of his abdomen that is approximately 2 cm wide by 1 cm tall.  There is no erythema and no fluctuance.  He said that he was able to express some liquid or fluid from it, but it is not draining at this time.   ED Results / Procedures / Treatments   Labs (all labs ordered are listed, but only abnormal results are displayed) Labs Reviewed - No data to display    PROCEDURES:  Critical Care performed: No  Procedures   MEDICATIONS ORDERED IN  ED: Medications - No data to display   IMPRESSION / MDM / ASSESSMENT AND PLAN / ED COURSE  I reviewed the triage vital signs and the nursing notes.                              Differential diagnosis includes, but is not limited to, abscess, cyst, insect bite.  Patient's presentation is most consistent with acute, uncomplicated illness.  Physical exam is most consistent with a cutaneous cyst, or possibly even a lymph node.  This does not look or feel like an abscess.  He may have drained some fluid from the cyst or it could have been an abscess but at this point he has some surrounding induration with no erythema and I do not feel he would benefit from incision and drainage.  I we will put him on Bactrim as a precaution and encouraged him to follow-up as an outpatient.  He is satisfied with this plan and I gave my usual and customary return precautions.  No indication of an emergency medical condition requiring additional treatment or observation at this time.       FINAL CLINICAL IMPRESSION(S) / ED DIAGNOSES   Final diagnoses:  Cutaneous cyst     Rx / DC Orders   ED Discharge Orders          Ordered    sulfamethoxazole-trimethoprim (BACTRIM DS) 800-160 MG tablet  2 times daily        07/31/21 0647             Note:  This document was prepared using Dragon voice recognition software and may include unintentional dictation errors.   Loleta Rose, MD 07/31/21 [phone]

## 2021-07-31 NOTE — Discharge Instructions (Signed)
The lesion on your abdomen does not look like an abscess at this time.  It may just be a simple cyst that does not require drainage.  Just to be safe we provided a prescription for antibiotics; please take the full course of treatment for the next 5 days.  Follow-up with your primary care provider as needed.  Return to the emergency department if you develop new or worsening symptoms that concern you.

## 2021-08-09 ENCOUNTER — Emergency Department: Payer: Medicare Other

## 2021-08-09 ENCOUNTER — Other Ambulatory Visit: Payer: Self-pay

## 2021-08-09 ENCOUNTER — Encounter: Payer: Self-pay | Admitting: Emergency Medicine

## 2021-08-09 ENCOUNTER — Emergency Department
Admission: EM | Admit: 2021-08-09 | Discharge: 2021-08-09 | Disposition: A | Discharge status: Home / Self Care | Payer: No Typology Code available for payment source | Attending: Emergency Medicine | Admitting: Emergency Medicine

## 2021-08-09 DIAGNOSIS — Y990 Civilian activity done for income or pay: Secondary | ICD-10-CM | POA: Diagnosis not present

## 2021-08-09 DIAGNOSIS — S3992XA Unspecified injury of lower back, initial encounter: Secondary | ICD-10-CM | POA: Diagnosis present

## 2021-08-09 DIAGNOSIS — S39012A Strain of muscle, fascia and tendon of lower back, initial encounter: Secondary | ICD-10-CM | POA: Diagnosis not present

## 2021-08-09 DIAGNOSIS — Y93E5 Activity, floor mopping and cleaning: Secondary | ICD-10-CM | POA: Insufficient documentation

## 2021-08-09 DIAGNOSIS — X58XXXA Exposure to other specified factors, initial encounter: Secondary | ICD-10-CM | POA: Diagnosis not present

## 2021-08-09 MED ORDER — LIDOCAINE 5 % EX PTCH: 1.0000 | MEDICATED_PATCH | Freq: Two times a day (BID) | CUTANEOUS | 0 refills | Status: AC | PRN

## 2021-08-09 MED ORDER — IBUPROFEN 800 MG PO TABS: 800.0000 mg | ORAL_TABLET | Freq: Three times a day (TID) | ORAL | 0 refills | Status: AC | PRN

## 2021-08-09 MED ORDER — KETOROLAC TROMETHAMINE 60 MG/2ML IM SOLN
60.0000 mg | Freq: Once | INTRAMUSCULAR | Status: AC
  Administered 2021-08-09: 60 mg via INTRAMUSCULAR
  Filled 2021-08-09: qty 2

## 2021-08-09 MED ORDER — CYCLOBENZAPRINE HCL 5 MG PO TABS: 5.0000 mg | ORAL_TABLET | Freq: Three times a day (TID) | ORAL | 0 refills | Status: DC | PRN

## 2021-08-09 MED ORDER — CYCLOBENZAPRINE HCL 10 MG PO TABS
10.0000 mg | ORAL_TABLET | Freq: Once | ORAL | Status: AC
  Administered 2021-08-09: 10 mg via ORAL
  Filled 2021-08-09: qty 1

## 2021-08-09 NOTE — ED Provider Notes (Incomplete)
[facility] Emergency Department Provider Note     Event Date/Time   First MD Initiated Contact with Patient 08/09/21 1237     (approximate)   History   Back Pain   HPI  Dwayne Macdonald is a 41 y.o. male presents to the ED with complaints of low back pain.  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  He denies any outright slip or trip, but reports the pain caused him to go down.  He works for [employer].  He denies any bladder or bowel incontinence, foot drop, or saddle anesthesia.     Physical Exam   Triage Vital Signs: ED Triage Vitals  Enc Vitals Group     BP 08/09/21 1236 (!) 143/83     Pulse Rate 08/09/21 1236 64     Resp 08/09/21 1236 17     Temp 08/09/21 1236 98.3 F (36.8 C)     Temp Source 08/09/21 1236 Oral     SpO2 08/09/21 1236 99 %     Weight 08/09/21 1216 235 lb 0.2 oz (106.6 kg)     Height 08/09/21 1216 5\' 6"  (1.676 m)     Head Circumference --      Peak Flow --      Pain Score 08/09/21 1216 8     Pain Loc --      Pain Edu? --      Excl. in GC? --     Most recent vital signs: Vitals:   08/09/21 1236  BP: (!) 143/83  Pulse: 64  Resp: 17  Temp: 98.3 F (36.8 C)  SpO2: 99%    General Awake, no distress. *** {**HEENT NCAT. PERRL. EOMI. No rhinorrhea. Mucous membranes are moist. **} CV:  Good peripheral perfusion. *** RESP:  Normal effort. *** ABD:  No distention. *** {**Other: **}   ED Results / Procedures / Treatments   Labs (all labs ordered are listed, but only abnormal results are displayed) Labs Reviewed - No data to display   EKG  ***  RADIOLOGY  {**I personally viewed and evaluated these images as part of my medical decision making, as well as reviewing the written report by the radiologist.  ED Provider Interpretation: ***}  No results found.   PROCEDURES:  Critical Care performed: {CriticalCareYesNo:19197::"Yes, see critical care procedure  note(s)","No"}  Procedures   MEDICATIONS ORDERED IN ED: Medications - No data to display   IMPRESSION / MDM / ASSESSMENT AND PLAN / ED COURSE  I reviewed the triage vital signs and the nursing notes.                              Differential diagnosis includes, but is not limited to, ***  Patient's presentation is most consistent with {EM COPA:27473}  {**The patient is on the cardiac monitor to evaluate for evidence of arrhythmia and/or significant heart rate changes.**}  Patient's diagnosis is consistent with ***. Patient will be discharged home with prescriptions for ***. Patient is to follow up with *** as needed or otherwise directed. Patient is given ED precautions to return to the ED for any worsening or new symptoms.     FINAL CLINICAL IMPRESSION(S) / ED DIAGNOSES   Final diagnoses:  None     Rx / DC Orders   ED Discharge Orders     None        Note:  This document was prepared using Dragon voice recognition software and may include unintentional dictation errors.

## 2021-08-09 NOTE — Discharge Instructions (Addendum)
Your exam and x-ray are normal at this time.  No sign of a fracture or dislocation.  You have muscle strain and muscle spasms in the back.  Take the prescription medicines as directed.  Use lidocaine patches as prescribed.  Follow-up with primary provider for ongoing symptoms.

## 2021-08-09 NOTE — ED Triage Notes (Addendum)
C/O lower back pain.  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  Denies falling, but states "I went down".  Works for [employer].  WC sheet pulled, no UDS required.

## 2022-01-03 ENCOUNTER — Other Ambulatory Visit: Payer: Self-pay

## 2022-01-03 ENCOUNTER — Emergency Department: Payer: Medicare Other

## 2022-01-03 ENCOUNTER — Emergency Department
Admission: EM | Admit: 2022-01-03 | Discharge: 2022-01-03 | Disposition: A | Discharge status: Home / Self Care | Payer: Medicare Other | Attending: Emergency Medicine | Admitting: Emergency Medicine

## 2022-01-03 DIAGNOSIS — R072 Precordial pain: Secondary | ICD-10-CM | POA: Insufficient documentation

## 2022-01-03 DIAGNOSIS — R079 Chest pain, unspecified: Secondary | ICD-10-CM

## 2022-01-03 LAB — CBC
HCT: 37.2 % — ABNORMAL LOW (ref 39.0–52.0)
Hemoglobin: 12.8 g/dL — ABNORMAL LOW (ref 13.0–17.0)
MCH: 29 pg (ref 26.0–34.0)
MCHC: 34.4 g/dL (ref 30.0–36.0)
MCV: 84.4 fL (ref 80.0–100.0)
Platelets: 218 10*3/uL (ref 150–400)
RBC: 4.41 MIL/uL (ref 4.22–5.81)
RDW: 11.9 % (ref 11.5–15.5)
WBC: 4.2 10*3/uL (ref 4.0–10.5)
nRBC: 0 % (ref 0.0–0.2)

## 2022-01-03 LAB — BASIC METABOLIC PANEL
Anion gap: 4 — ABNORMAL LOW (ref 5–15)
BUN: 10 mg/dL (ref 6–20)
CO2: 29 mmol/L (ref 22–32)
Calcium: 9.3 mg/dL (ref 8.9–10.3)
Chloride: 109 mmol/L (ref 98–111)
Creatinine, Ser: 0.95 mg/dL (ref 0.61–1.24)
GFR, Estimated: >60 mL/min (ref >60)
Glucose, Bld: 88 mg/dL (ref 70–99)
Potassium: 3.3 mmol/L — ABNORMAL LOW (ref 3.5–5.1)
Sodium: 142 mmol/L (ref 135–145)

## 2022-01-03 LAB — TROPONIN I (HIGH SENSITIVITY)
Troponin I (High Sensitivity): 5 ng/L (ref <18)
Troponin I (High Sensitivity): 5 ng/L (ref <18)

## 2022-01-03 MED ORDER — FAMOTIDINE 20 MG PO TABS
40.0000 mg | ORAL_TABLET | Freq: Once | ORAL | Status: AC
  Administered 2022-01-03: 40 mg via ORAL
  Filled 2022-01-03: qty 2

## 2022-01-03 NOTE — ED Provider Notes (Signed)
[facility] Provider Note  Patient Contact: 3:50 PM (approximate)   History   Chest Pain   HPI  Dwayne Macdonald is a 41 y.o. male with a history of GERD, presents to the emergency department with right parasternal chest pain that started last night.  Patient states that his pain is more midsternal today and associated with some shortness of breath when the pain is intense.  He reports that the pain seems to come and go.  States that the pain is reproducible to palpation.  Patient works at [employer] and he states that he does lift heavily often.  He denies family history of cardiac disease.  He denies fever and chills.  No recent falls or other mechanisms of trauma.  He reports that his pain has improved some while in the emergency department.  He denies daily smoking.      Physical Exam   Triage Vital Signs: ED Triage Vitals  Enc Vitals Group     BP 01/03/22 1459 (!) 139/90     Pulse Rate 01/03/22 1459 74     Resp 01/03/22 1459 18     Temp 01/03/22 1459 98 F (36.7 C)     Temp Source 01/03/22 1459 Oral     SpO2 01/03/22 1459 99 %     Weight 01/03/22 1537 235 lb 0.2 oz (106.6 kg)     Height 01/03/22 1537 5\' 6"  (1.676 m)     Head Circumference --      Peak Flow --      Pain Score 01/03/22 1459 7     Pain Loc --      Pain Edu? --      Excl. in GC? --     Most recent vital signs: Vitals:   01/03/22 1459 01/03/22 1831  BP: (!) 139/90 130/88  Pulse: 74 70  Resp: 18 18  Temp: 98 F (36.7 C)   SpO2: 99% 99%     General: Alert and in no acute distress. Eyes:  PERRL. EOMI. Head: No acute traumatic findings ENT:      Nose: No congestion/rhinnorhea.      Mouth/Throat: Mucous membranes are moist. Neck: No stridor. No cervical spine tenderness to palpation. Cardiovascular:  Good peripheral perfusion. Mid-sternal chest pain is reproduced with palpation.  Respiratory: Normal respiratory effort without tachypnea or retractions. Lungs CTAB. Good air  entry to the bases with no decreased or absent breath sounds. Gastrointestinal: Bowel sounds 4 quadrants. Soft and nontender to palpation. No guarding or rigidity. No palpable masses. No distention. No CVA tenderness. Musculoskeletal: Full range of motion to all extremities.  Neurologic:  No gross focal neurologic deficits are appreciated.  Skin:   No rash noted Other:   ED Results / Procedures / Treatments   Labs (all labs ordered are listed, but only abnormal results are displayed) Labs Reviewed  BASIC METABOLIC PANEL - Abnormal; Notable for the following components:      Result Value   Potassium 3.3 (*)    Anion gap 4 (*)    All other components within normal limits  CBC - Abnormal; Notable for the following components:   Hemoglobin 12.8 (*)    HCT 37.2 (*)    All other components within normal limits  TROPONIN I (HIGH SENSITIVITY)  TROPONIN I (HIGH SENSITIVITY)     EKG  Normal sinus rhythm without ST segment elevation or other apparent arrhythmia.   RADIOLOGY  I personally viewed and evaluated these images as part  of my medical decision making, as well as reviewing the written report by the radiologist.  ED Provider Interpretation: No consolidations, pneumothorax or pneumomediastinum on chest x-ray.   PROCEDURES:  Critical Care performed: No  Procedures   MEDICATIONS ORDERED IN ED: Medications  famotidine (PEPCID) tablet 40 mg (40 mg Oral Given 01/03/22 1606)     IMPRESSION / MDM / ASSESSMENT AND PLAN / ED COURSE  I reviewed the triage vital signs and the nursing notes.                              Assessment and plan Chest pain 41 year old male with unremarkable past medical history presents to the emergency department with current midsternal chest pain that started last night is right parasternal chest pain.  Vital signs are reassuring at triage.  On exam, patient appeared to be resting comfortably and stated that his pain improved since waiting in  the emergency department.  His pain was reproduced to palpation.  Patient did not seem overly breathless and was able to speak in complete sentences without apparent shortness of breath.  Differential diagnosis included STEMI, NSTEMI, pneumothorax, pneumonia, costochondritis, PE, GERD...  Given PERC score of 0, low suspicion for PE at this time.  Given history of GERD, will administer Pepcid and will obtain second troponin and will reassess.  Second troponin within range.  Patient reported that his pain resolved after Pepcid, increasing suspicion for combination of acid reflux and costochondritis for source of patient's discomfort.  Recommended Pepcid at discharge and Tylenol and ibuprofen alternating for costochondritis.  Return precautions were given to return with new or worsening symptoms.  All patient questions were answered. Clinical Course as of 01/03/22 1833  Mon Jan 03, 2022  1548 DG Chest 2 View [JW]    Clinical Course User Index [JW] Lannie Fields, [place]     FINAL CLINICAL IMPRESSION(S) / ED DIAGNOSES   Final diagnoses:  Chest pain, unspecified type     Rx / DC Orders   ED Discharge Orders     None        Note:  This document was prepared using Dragon voice recognition software and may include unintentional dictation errors.   Vallarie Mare [place], PA-C 01/03/22 1833    Vanessa [place], MD 01/04/22 [phone]

## 2022-01-03 NOTE — ED Triage Notes (Signed)
Pt to ED via POV from home. Pt reports centralized CP and jaw pain that started today. Pt also reports mild HA. Pt denies blood thinners. Pt reports no known medical hx.

## 2022-01-03 NOTE — Discharge Instructions (Signed)
You can take 40 mg of Pepcid once daily.

## 2022-01-03 NOTE — ED Notes (Signed)
See triage note  Presents with "sharp" chest pain which started last pm  States pain started after work last pm   did take some tylenol with min relief  Pain increases with inspiration  No fever

## 2022-02-09 ENCOUNTER — Encounter: Payer: Self-pay | Admitting: Emergency Medicine

## 2022-02-09 ENCOUNTER — Emergency Department
Admission: EM | Admit: 2022-02-09 | Discharge: 2022-02-09 | Disposition: A | Discharge status: Home / Self Care | Payer: Medicare Other | Attending: Emergency Medicine | Admitting: Emergency Medicine

## 2022-02-09 DIAGNOSIS — Z8616 Personal history of COVID-19: Secondary | ICD-10-CM | POA: Insufficient documentation

## 2022-02-09 DIAGNOSIS — R051 Acute cough: Secondary | ICD-10-CM | POA: Diagnosis not present

## 2022-02-09 DIAGNOSIS — Z1152 Encounter for screening for COVID-19: Secondary | ICD-10-CM | POA: Diagnosis not present

## 2022-02-09 DIAGNOSIS — R059 Cough, unspecified: Secondary | ICD-10-CM | POA: Diagnosis present

## 2022-02-09 LAB — RESP PANEL BY RT-PCR (RSV, FLU A&B, COVID)  RVPGX2
Influenza A by PCR: NEGATIVE
Influenza B by PCR: NEGATIVE
Resp Syncytial Virus by PCR: NEGATIVE
SARS Coronavirus 2 by RT PCR: NEGATIVE

## 2022-02-09 LAB — GROUP A STREP BY PCR: Group A Strep by PCR: NOT DETECTED

## 2022-02-09 NOTE — ED Notes (Signed)
Pt signed esignature. 

## 2022-02-09 NOTE — ED Triage Notes (Signed)
Pt presents via POV with complaints of cough that started today. No meds taken PTA. States the cough is dry in nature with an associated sore throat. Denies CP or SOB.

## 2022-02-09 NOTE — ED Provider Notes (Signed)
[facility] Provider Note  Patient Contact: 11:49 PM (approximate)   History   Cough   HPI  Dwayne Macdonald is a 41 y.o. male presents to the emergency department with headaches, rhinorrhea, nasal congestion and nonproductive cough that started today.  No chest pain or abdominal pain.  No vomiting or diarrhea.      Physical Exam   Triage Vital Signs: ED Triage Vitals  Enc Vitals Group     BP 02/09/22 2130 (!) 151/86     Pulse Rate 02/09/22 2130 94     Resp 02/09/22 2130 16     Temp 02/09/22 2130 99.1 F (37.3 C)     Temp Source 02/09/22 2130 Oral     SpO2 02/09/22 2130 99 %     Weight 02/09/22 2130 236 lb (107 kg)     Height 02/09/22 2130 5\' 9"  (1.753 m)     Head Circumference --      Peak Flow --      Pain Score 02/09/22 2255 0     Pain Loc --      Pain Edu? --      Excl. in GC? --     Most recent vital signs: Vitals:   02/09/22 2130  BP: (!) 151/86  Pulse: 94  Resp: 16  Temp: 99.1 F (37.3 C)  SpO2: 99%     Constitutional: Alert and oriented. Patient is lying supine. Eyes: Conjunctivae are normal. PERRL. EOMI. Head: Atraumatic. ENT:      Ears: Tympanic membranes are mildly injected with mild effusion bilaterally.       Nose: No congestion/rhinnorhea.      Mouth/Throat: Mucous membranes are moist. Posterior pharynx is mildly erythematous.  Hematological/Lymphatic/Immunilogical: No cervical lymphadenopathy.  Cardiovascular: Normal rate, regular rhythm. Normal S1 and S2.  Good peripheral circulation. Respiratory: Normal respiratory effort without tachypnea or retractions. Lungs CTAB. Good air entry to the bases with no decreased or absent breath sounds. Gastrointestinal: Bowel sounds 4 quadrants. Soft and nontender to palpation. No guarding or rigidity. No palpable masses. No distention. No CVA tenderness. Musculoskeletal: Full range of motion to all extremities. No gross deformities appreciated. Neurologic:  Normal speech and  language. No gross focal neurologic deficits are appreciated.  Skin:  Skin is warm, dry and intact. No rash noted. Psychiatric: Mood and affect are normal. Speech and behavior are normal. Patient exhibits appropriate insight and judgement.    ED Results / Procedures / Treatments   Labs (all labs ordered are listed, but only abnormal results are displayed) Labs Reviewed  GROUP A STREP BY PCR  RESP PANEL BY RT-PCR (RSV, FLU A&B, COVID)  [id number]        PROCEDURES:  Critical Care performed: No  Procedures   MEDICATIONS ORDERED IN ED: Medications - No data to display   IMPRESSION / MDM / ASSESSMENT AND PLAN / ED COURSE  I reviewed the triage vital signs and the nursing notes.                              Assessment and plan Cough 41 year old male presents to the emergency department with cough that started today.  Patient was hypertensive at triage but vital signs otherwise reassuring.  On exam, patient was alert and nontoxic-appearing with no increased work of breathing.  Patient tested negative for group A strep, COVID-19, influenza and RSV  Rest and hydration were encouraged at home.  Return precautions  were given to return with new or worsening symptoms.     FINAL CLINICAL IMPRESSION(S) / ED DIAGNOSES   Final diagnoses:  Acute cough     Rx / DC Orders   ED Discharge Orders     None        Note:  This document was prepared using Dragon voice recognition software and may include unintentional dictation errors.   Pia Mau [place], PA-C 02/09/22 2351    Minna Antis, MD 02/10/22 1949

## 2022-03-22 ENCOUNTER — Emergency Department
Admission: EM | Admit: 2022-03-22 | Discharge: 2022-03-22 | Disposition: A | Discharge status: Home / Self Care | Payer: 59 | Attending: Emergency Medicine | Admitting: Emergency Medicine

## 2022-03-22 ENCOUNTER — Encounter: Payer: Self-pay | Admitting: Emergency Medicine

## 2022-03-22 DIAGNOSIS — H6123 Impacted cerumen, bilateral: Secondary | ICD-10-CM | POA: Diagnosis not present

## 2022-03-22 DIAGNOSIS — H9201 Otalgia, right ear: Secondary | ICD-10-CM | POA: Diagnosis present

## 2022-03-22 MED ORDER — CARBAMIDE PEROXIDE 6.5 % OT SOLN
5.0000 [drp] | Freq: Once | OTIC | Status: AC
  Administered 2022-03-22: 5 [drp] via OTIC
  Filled 2022-03-22: qty 15

## 2022-03-22 MED ORDER — IBUPROFEN 800 MG PO TABS
800.0000 mg | ORAL_TABLET | Freq: Once | ORAL | Status: AC
  Administered 2022-03-22: 800 mg via ORAL
  Filled 2022-03-22: qty 1

## 2022-03-22 MED ORDER — CARBAMIDE PEROXIDE 6.5 % OT SOLN: 5.0000 [drp] | Freq: Two times a day (BID) | OTIC | 1 refills | Status: AC

## 2022-03-22 NOTE — ED Notes (Signed)
RN irrigated both ears with 80-100cc of NS. Small amounts of ear wax out of both ears.   Provider aware

## 2022-03-22 NOTE — ED Provider Notes (Signed)
[facility] Provider Note    Event Date/Time   First MD Initiated Contact with Patient 03/22/22 [phone]     (approximate)   History   Otalgia   HPI  Dwayne Macdonald is a 42 y.o. male who presents to the emergency department with EMS with right ear pain.  No fevers, cough, sore throat.  History provided by patient.    Past Medical History:  Diagnosis Date   GERD (gastroesophageal reflux disease)    probable, based on symptoms and repeat ED visits    History reviewed. No pertinent surgical history.  MEDICATIONS:  Prior to Admission medications   Medication Sig Start Date End Date Taking? Authorizing Provider  aluminum-magnesium hydroxide-simethicone (MAALOX) [phone] MG/5ML SUSP Take 30 mLs by mouth 4 (four) times daily -  before meals and at bedtime. 05/05/20   Carrie Mew, MD  ibuprofen (ADVIL) 800 MG tablet Take 1 tablet (800 mg total) by mouth every 8 (eight) hours as needed. 08/09/21   Menshew, Dannielle Karvonen, PA-C  pantoprazole (PROTONIX) 40 MG tablet Take 1 tablet (40 mg total) by mouth daily. 04/18/21 05/18/21  Nance Pear, MD    Physical Exam   Triage Vital Signs: ED Triage Vitals  Enc Vitals Group     BP 03/22/22 0039 130/82     Pulse Rate 03/22/22 0039 66     Resp 03/22/22 0039 20     Temp 03/22/22 0039 98.3 F (36.8 C)     Temp Source 03/22/22 0039 Oral     SpO2 03/22/22 0039 100 %     Weight 03/22/22 0035 238 lb 1.6 oz (108 kg)     Height 03/22/22 0035 5\' 9"  (1.753 m)     Head Circumference --      Peak Flow --      Pain Score 03/22/22 0039 7     Pain Loc --      Pain Edu? --      Excl. in [place]? --     Most recent vital signs: Vitals:   03/22/22 0039  BP: 130/82  Pulse: 66  Resp: 20  Temp: 98.3 F (36.8 C)  SpO2: 100%    CONSTITUTIONAL: Alert, responds appropriately to questions. Well-appearing; well-nourished HEAD: Normocephalic, atraumatic EYES: Conjunctivae clear, pupils appear equal, sclera  nonicteric ENT: normal nose; moist mucous membranes, no pain with manipulation of the pinna bilaterally.  No mastoiditis.  Both external auditory canals are impacted with cerumen.  Unable to visualize the TMs. NECK: Supple, normal ROM CARD: RRR; S1 and S2 appreciated RESP: Normal chest excursion without splinting or tachypnea; breath sounds clear and equal bilaterally; no wheezes, no rhonchi, no rales, no hypoxia or respiratory distress, speaking full sentences ABD/GI: Non-distended; soft, non-tender, no rebound, no guarding, no peritoneal signs BACK: The back appears normal EXT: Normal ROM in all joints; no deformity noted, no edema SKIN: Normal color for age and race; warm; no rash on exposed skin NEURO: Moves all extremities equally, normal speech PSYCH: The patient's mood and manner are appropriate.   ED Results / Procedures / Treatments   LABS: (all labs ordered are listed, but only abnormal results are displayed) Labs Reviewed - No data to display   EKG:  RADIOLOGY: My personal review and interpretation of imaging:    I have personally reviewed all radiology reports.   No results found.   PROCEDURES:  Critical Care performed: No     .Ear Cerumen Removal  Date/Time: 03/22/2022 5:27 AM  Performed  by: Parke Poisson, RN Authorized by: Khalid Lacko, Layla Maw, DO   Consent:    Consent obtained:  Verbal   Consent given by:  Patient   Risks discussed:  Dizziness, bleeding, infection, incomplete removal, pain and TM perforation   Alternatives discussed:  Referral Universal protocol:    Procedure explained and questions answered to patient or proxy's satisfaction: yes     Relevant documents present and verified: yes     Immediately prior to procedure, a time out was called: yes     Patient identity confirmed:  Verbally with patient Procedure details:    Location:  L ear and R ear   Procedure type: irrigation     Procedure outcomes: cerumen removed   Post-procedure  details:    Inspection:  Some cerumen remaining and no bleeding   Hearing quality:  Improved   Procedure completion:  Tolerated well, no immediate complications     IMPRESSION / MDM / ASSESSMENT AND PLAN / ED COURSE  I reviewed the triage vital signs and the nursing notes.    Patient here with right ear pain.    DIFFERENTIAL DIAGNOSIS (includes but not limited to):   Patient has bilateral cerumen impaction.  No signs of otitis externa, mastoiditis on exam.  Unable to evaluate for otitis media given impaction bilaterally.   Patient's presentation is most consistent with acute, uncomplicated illness.   PLAN: Will place Debrox in both ears and attempt to irrigate.  Will give ibuprofen for pain.   MEDICATIONS GIVEN IN ED: Medications  carbamide peroxide (DEBROX) 6.5 % OTIC (EAR) solution 5 drop (5 drops Both EARS Given 03/22/22 0415)  ibuprofen (ADVIL) tablet 800 mg (800 mg Oral Given 03/22/22 0414)     ED COURSE: Was irrigated and a large amount of wax came out of both ears especially on the right side but still has impaction that is deep and close to the TM.  I recommended follow-up with ENT as an outpatient.  Will discharge on Debrox.  He states he is feeling better pain has improved and he is hearing better out of the right ear.   At this time, I do not feel there is any life-threatening condition present. I reviewed all nursing notes, vitals, pertinent previous records.  All lab and urine results, EKGs, imaging ordered have been independently reviewed and interpreted by myself.  I reviewed all available radiology reports from any imaging ordered this visit.  Based on my assessment, I feel the patient is safe to be discharged home without further emergent workup and can continue workup as an outpatient as needed. Discussed all findings, treatment plan as well as usual and customary return precautions.  They verbalize understanding and are comfortable with this plan.  Outpatient  follow-up has been provided as needed.  All questions have been answered.    CONSULTS:  none   OUTSIDE RECORDS REVIEWED: Reviewed last internal medicine note with Delanna Notice on 08/04/2021.       FINAL CLINICAL IMPRESSION(S) / ED DIAGNOSES   Final diagnoses:  Bilateral impacted cerumen     Rx / DC Orders   ED Discharge Orders          Ordered    carbamide peroxide (DEBROX) 6.5 % OTIC solution  2 times daily        03/22/22 0549             Note:  This document was prepared using Dragon voice recognition software and may include unintentional dictation errors.  Kaislyn Gulas, Delice [place], DO 03/22/22 [phone]

## 2022-03-22 NOTE — Discharge Instructions (Signed)
You may alternate Tylenol 1000 mg every 6 hours as needed for pain, fever and Ibuprofen 800 mg every 6-8 hours as needed for pain, fever.  Please take Ibuprofen with food.  Do not take more than 4000 mg of Tylenol (acetaminophen) in a 24 hour period. ° °

## 2022-03-22 NOTE — ED Triage Notes (Signed)
Pt presents via [facility] from home with R ear pain with headache for the last few hours. No meds taken PTA. Denies fevers, drainage, or nasal congestion.    VSS with EMS.

## 2022-08-27 ENCOUNTER — Emergency Department
Admission: EM | Admit: 2022-08-27 | Discharge: 2022-08-27 | Disposition: A | Discharge status: Home / Self Care | Payer: Medicare HMO | Attending: Emergency Medicine | Admitting: Emergency Medicine

## 2022-08-27 ENCOUNTER — Other Ambulatory Visit: Payer: Self-pay

## 2022-08-27 ENCOUNTER — Encounter: Payer: Self-pay | Admitting: Emergency Medicine

## 2022-08-27 DIAGNOSIS — R059 Cough, unspecified: Secondary | ICD-10-CM | POA: Diagnosis present

## 2022-08-27 DIAGNOSIS — U071 COVID-19: Secondary | ICD-10-CM | POA: Diagnosis not present

## 2022-08-27 DIAGNOSIS — J069 Acute upper respiratory infection, unspecified: Secondary | ICD-10-CM | POA: Insufficient documentation

## 2022-08-27 LAB — RESP PANEL BY RT-PCR (RSV, FLU A&B, COVID)  RVPGX2
Influenza A by PCR: NEGATIVE
Influenza B by PCR: NEGATIVE
Resp Syncytial Virus by PCR: NEGATIVE
SARS Coronavirus 2 by RT PCR: POSITIVE — AB

## 2022-08-27 MED ORDER — BENZONATATE 100 MG PO CAPS: 100.0000 mg | ORAL_CAPSULE | Freq: Three times a day (TID) | ORAL | 0 refills | Status: AC | PRN

## 2022-08-27 MED ORDER — ACETAMINOPHEN 325 MG PO TABS: 650.0000 mg | ORAL_TABLET | ORAL | 2 refills | Status: AC | PRN

## 2022-08-27 NOTE — Discharge Instructions (Addendum)
Your COVID test was positive today.  Stay home and away from others until your symptoms have improved and you are fever free without medication for 24 hours.  Continue to wear mask around others for 5 additional days after symptom free.

## 2022-08-27 NOTE — ED Triage Notes (Signed)
Pt via POV from home. Pt c/o dry cough, fever, and headache for the past 3 days. Denies any sick contacts. Pt is A&Ox4 and NAD

## 2022-08-27 NOTE — ED Provider Notes (Signed)
[facility] Emergency Department Provider Note     Event Date/Time   First MD Initiated Contact with Patient 08/27/22 1210     (approximate)   History   Cough and Fever   HPI  Dwayne Macdonald is a 42 y.o. male presents to the ED for evaluation of a dry cough x 2 days.  Associated symptoms include fever and headache.  Patient denies recent illnesses or sick contacts.  He has tried Tylenol with minimal relief.  Endorses fever. Denies chest pain, shortness of breath, abdominal pain, vomiting and nausea.  No other complaints at this time.  No pertinent past medical history.   Physical Exam   Triage Vital Signs: ED Triage Vitals  Enc Vitals Group     BP 08/27/22 1159 123/85     Pulse Rate 08/27/22 1159 95     Resp 08/27/22 1159 18     Temp 08/27/22 1159 98.4 F (36.9 C)     Temp Source 08/27/22 1159 Oral     SpO2 08/27/22 1159 100 %     Weight 08/27/22 1157 230 lb (104.3 kg)     Height 08/27/22 1157 5\' 9"  (1.753 m)     Head Circumference --      Peak Flow --      Pain Score 08/27/22 1157 7     Pain Loc --      Pain Edu? --      Excl. in GC? --     Most recent vital signs: Vitals:   08/27/22 1159  BP: 123/85  Pulse: 95  Resp: 18  Temp: 98.4 F (36.9 C)  SpO2: 100%   General: Alert and oriented. INAD.  Skin:  Warm, dry and intact. No rashes or lesions noted.     Head:  NCAT.  Eyes:  PERRLA. EOMI. Conjunctivae clear. Ears:  EACs patent. Tympanic membranes clear bilaterally.  Nose:   Mucosa is moist. No rhinorrhea. Throat: Oropharynx clear. No erythema or exudates. Tonsils no enlarged. Uvula midline. Neck:   No cervical lymphadenopathy.  CV:  Good peripheral perfusion. RRR.  RESP:  Normal effort. LCTAB.   ABD:  No distention. Soft, Non tender.    ED Results / Procedures / Treatments   Labs (all labs ordered are listed, but only abnormal results are displayed) Labs Reviewed  RESP PANEL BY RT-PCR (RSV, FLU A&B, COVID)  RVPGX2 -  Abnormal; Notable for the following components:      Result Value   SARS Coronavirus 2 by RT PCR POSITIVE (*)    All other components within normal limits   History and physical examination do not warrant a lab work up or imaging at this time.   No results found.  PROCEDURES:  Critical Care performed: No  Procedures  MEDICATIONS ORDERED IN ED: Medications - No data to display  IMPRESSION / MDM / ASSESSMENT AND PLAN / ED COURSE  I reviewed the triage vital signs and the nursing notes.                               42 y.o. male presents to the emergency department for evaluation and treatment of acute dry cough associated symptoms of fever and headache. See HPI for further details. Vital signs and physical exam are unremarkable.  Differential diagnosis includes, but is not limited to COVID, influenza, RSV, acute bronchitis, viral URI.  Respiratory panel ordered.  COVID positive.  Patient is given CDC guidelines on isolation and quarantine.  Patient is in stable condition to be discharged home with prescriptions for Tylenol and Tessalon Perles. Patient is to follow up with [facility] clinic as needed or otherwise directed. Patient is given ED precautions to return to the ED for any worsening or new symptoms. Patient verbalizes understanding. All questions and concerns were addressed during ED visit.    Patient's presentation is most consistent with acute complicated illness / injury requiring diagnostic workup.  FINAL CLINICAL IMPRESSION(S) / ED DIAGNOSES   Final diagnoses:  Upper respiratory tract infection, unspecified type     Rx / DC Orders   ED Discharge Orders          Ordered    benzonatate (TESSALON PERLES) 100 MG capsule  3 times daily PRN        08/27/22 1324    acetaminophen (TYLENOL) 325 MG tablet  Every 4 hours PRN        08/27/22 1324             Note:  This document was prepared using Dragon voice recognition software and may include unintentional  dictation errors.    Larke, Zyona [place] A, PA-C 08/27/22 1809    Trinna Post, MD 08/27/22 [phone]

## 2022-08-27 NOTE — ED Notes (Signed)
Swab sent with temporary label sent to lab at this time.

## 2022-10-10 IMAGING — DX DG CHEST 1V PORT
1 series · 1 of 1 positions shown · non-contrast
Comparison: March 15, 2021

CLINICAL DATA: Left-sided chest pain.

EXAM:
PORTABLE CHEST 1 VIEW

[chest ap]
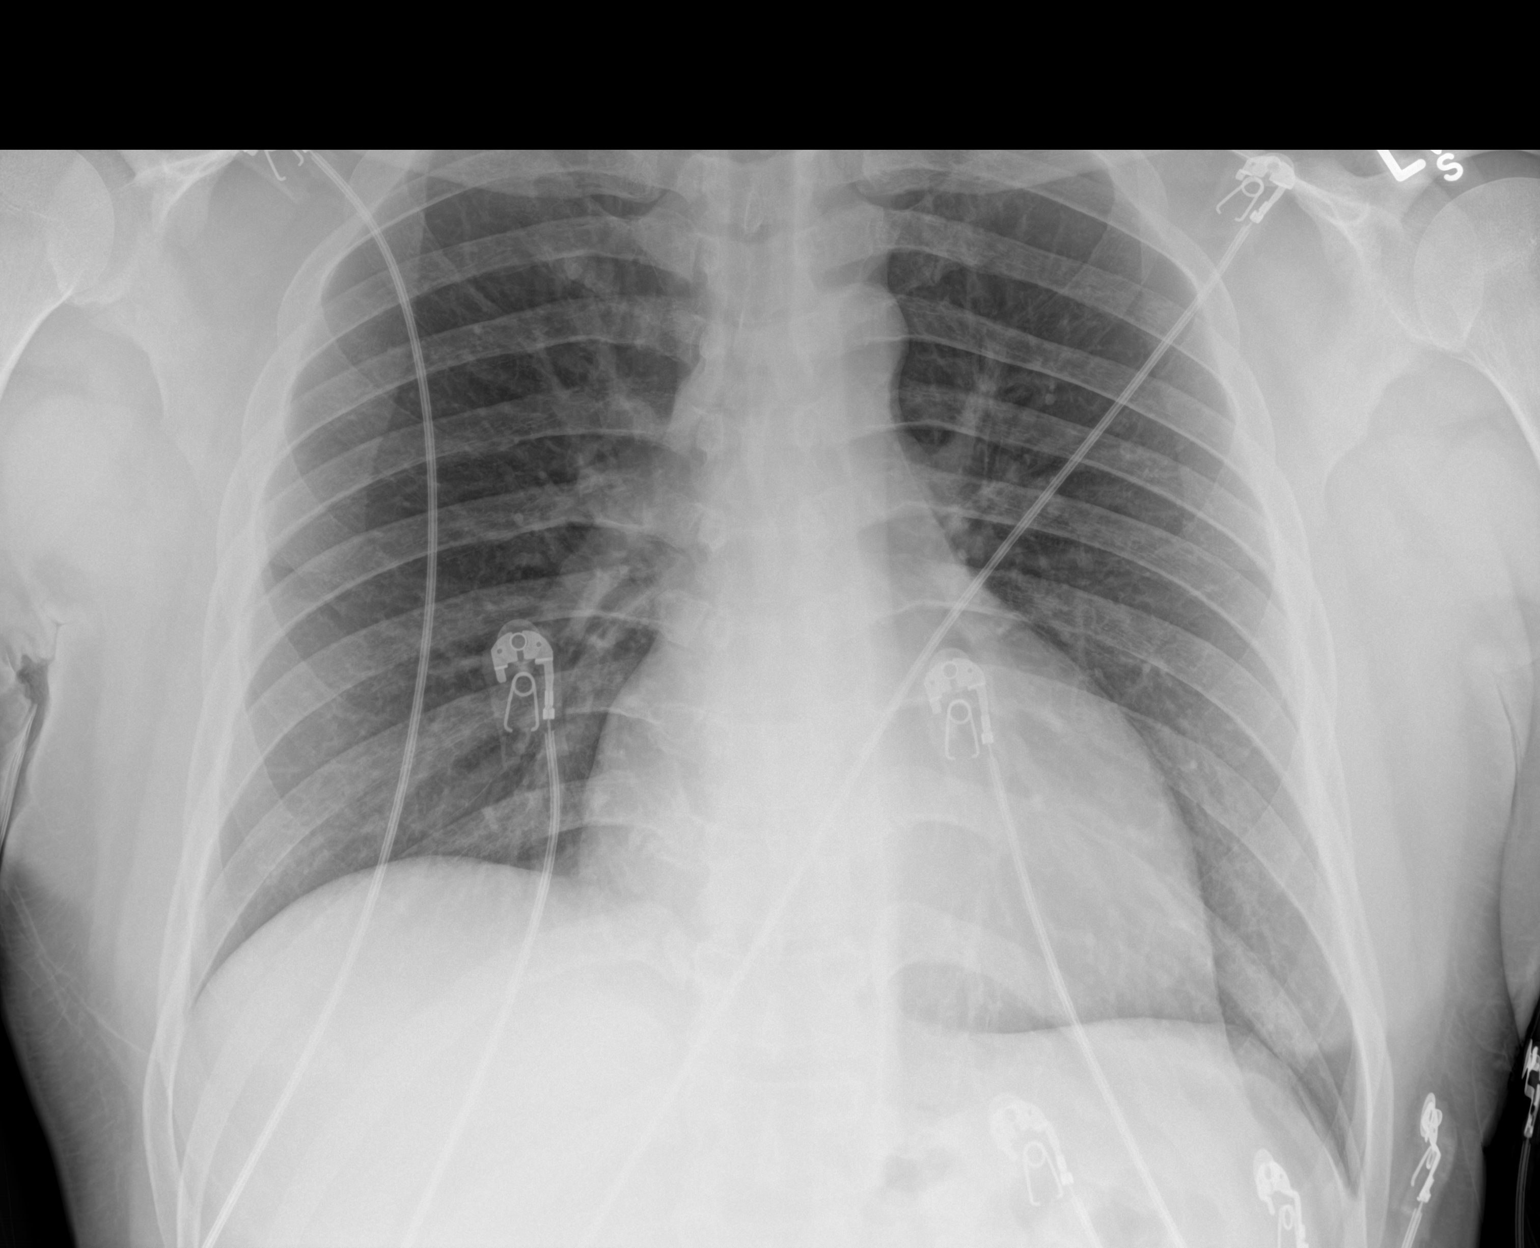

[1 of 1 positions shown; findings below may reference images not displayed]

FINDINGS: The heart size and mediastinal contours are within normal limits.
Both lungs are clear. The visualized skeletal structures are
unremarkable.
IMPRESSION: No active disease.

## 2023-01-11 DIAGNOSIS — R69 Illness, unspecified: Secondary | ICD-10-CM | POA: Diagnosis not present

## 2023-01-23 DIAGNOSIS — R519 Headache, unspecified: Secondary | ICD-10-CM | POA: Diagnosis not present

## 2023-01-23 DIAGNOSIS — R059 Cough, unspecified: Secondary | ICD-10-CM | POA: Diagnosis not present

## 2023-01-23 DIAGNOSIS — J069 Acute upper respiratory infection, unspecified: Secondary | ICD-10-CM | POA: Diagnosis not present

## 2023-01-23 DIAGNOSIS — R0981 Nasal congestion: Secondary | ICD-10-CM | POA: Diagnosis not present

## 2023-01-23 DIAGNOSIS — Z1152 Encounter for screening for COVID-19: Secondary | ICD-10-CM | POA: Diagnosis not present

## 2023-04-12 ENCOUNTER — Other Ambulatory Visit: Payer: Self-pay

## 2023-04-12 DIAGNOSIS — R0981 Nasal congestion: Secondary | ICD-10-CM | POA: Diagnosis present

## 2023-04-12 DIAGNOSIS — H6122 Impacted cerumen, left ear: Secondary | ICD-10-CM | POA: Insufficient documentation

## 2023-04-12 NOTE — ED Triage Notes (Signed)
Pt to ED via EMS from home, pt reports left side ear pain with ringing that has given him a headache, pt denies cough congestion fever.

## 2023-04-13 ENCOUNTER — Emergency Department
Admission: EM | Admit: 2023-04-13 | Discharge: 2023-04-13 | Disposition: A | Discharge status: Home / Self Care | Payer: 59 | Attending: Emergency Medicine | Admitting: Emergency Medicine

## 2023-04-13 DIAGNOSIS — H6122 Impacted cerumen, left ear: Secondary | ICD-10-CM

## 2023-04-13 NOTE — Discharge Instructions (Signed)
Return to the ER right away if you experience a severe headache, fevers, difficulty speaking, numbness, weakness or other concerns.  Please call

## 2023-04-13 NOTE — ED Provider Notes (Signed)
[facility] Provider Note    Event Date/Time   First MD Initiated Contact with Patient 04/13/23 0107     (approximate)   History   Otalgia   HPI  Dwayne Macdonald is a 43 y.o. male reports a history of previous wax in his ear.  Reports about a month ago he was seen by Dr. And he had to have his ear washed out.  He was having symptoms of a full feeling in rushing feeling in his left ear.  That alleviated his symptoms.  Again about a week ago he started noticed that his left ear felt congested.   Reports he feels like he has earwax buildup in the left ear again.  He currently reports no headache no cough or fevers.  His triage note reports that he had a headache, patient denies this or other acute reports he feels more like a fullness in his ear canal.  He has also been feeling a little bit of a little bit of a feeling of a clicking type sound in the left ear.  No numbness or weakness     Physical Exam   Triage Vital Signs: ED Triage Vitals [04/12/23 2226]  Encounter Vitals Group     BP (!) 148/91     Systolic BP Percentile      Diastolic BP Percentile      Pulse Rate 76     Resp 20     Temp 98.2 F (36.8 C)     Temp src      SpO2 95 %     Weight 230 lb (104.3 kg)     Height 5\' 9"  (1.753 m)     Head Circumference      Peak Flow      Pain Score 8     Pain Loc      Pain Education      Exclude from Growth Chart     Most recent vital signs: Vitals:   04/12/23 2226  BP: (!) 148/91  Pulse: 76  Resp: 20  Temp: 98.2 F (36.8 C)  SpO2: 95%     General: Awake, no distress.  Normocephalic atraumatic very pleasant fully oriented moves all extremities well without deficit.  Extraocular movements are normal.  No swelling tenderness or discomfort around the mastoid region of the left side of the face.  Left ear canal is normal with exception to approximately 80% cerumen impaction in the left ear.  Visualizable portion of the tympanic membrane  appears normal without evidence of acute otitis CV:  Good peripheral perfusion.  Resp:  Normal effort.  Abd:  No distention.  Other:  Moves all extremities well sits up without difficulty extraocular movements normal speech is clear  Careful auscultation of the neck reveals no bruits.  Lung sounds clear.  Heart tones normal.  Speaks in full clear sentences   ED Results / Procedures / Treatments   Labs (all labs ordered are listed, but only abnormal results are displayed) Labs Reviewed - No data to display   EKG     RADIOLOGY     PROCEDURES:  Critical Care performed: No  Procedures   MEDICATIONS ORDERED IN ED: Medications - No data to display   IMPRESSION / MDM / ASSESSMENT AND PLAN / ED COURSE  I reviewed the triage vital signs and the nursing notes.  Differential diagnosis includes, but is not limited to, possible cerumen impaction, tendinitis, based on clinical exam and history no findings would be highly suggestive of underlying central neurologic lesion, high risk for critical carotid stenosis, mastoiditis, mass lesion etc.  Discussed with the patient and he is agreeable to follow-up with the primary care he does not currently have 1 but is agreeable to establishing 1 and lives in [place].  Provided information on local clinic and referred to primary care.  Discussed with him recommended over-the-counter use of Debrox to help with relief of wax and softening of wax in the ear.  He is agreeable with this plan.  Patient's presentation is most consistent with acute, uncomplicated illness.   Return precautions and treatment recommendations and follow-up discussed with the patient who is agreeable with the plan.        FINAL CLINICAL IMPRESSION(S) / ED DIAGNOSES   Final diagnoses:  Impacted cerumen of left ear     Rx / DC Orders   ED Discharge Orders          Ordered    Ambulatory Referral to Primary Care (Establish Care)         04/13/23 0115             Note:  This document was prepared using Dragon voice recognition software and may include unintentional dictation errors.   Sharyn Creamer, MD 04/13/23 [phone]

## 2024-02-14 DIAGNOSIS — H9209 Otalgia, unspecified ear: Secondary | ICD-10-CM | POA: Diagnosis present

## 2024-02-14 DIAGNOSIS — H6122 Impacted cerumen, left ear: Secondary | ICD-10-CM | POA: Diagnosis not present

## 2024-02-14 MED ORDER — DOCUSATE SODIUM 50 MG/5ML PO LIQD
100.0000 mg | Freq: Once | ORAL | Status: AC
  Administered 2024-02-14: 17:00:00 100 mg via ORAL
  Filled 2024-02-14: qty 10

## 2024-02-14 MED ORDER — CARBAMIDE PEROXIDE 6.5 % OT SOLN
5.0000 [drp] | OTIC | Status: DC | PRN
  Administered 2024-02-14: 18:00:00 5 [drp] via OTIC
  Filled 2024-02-14: qty 15

## 2024-02-14 NOTE — ED Provider Notes (Signed)
° °  [facility] Provider Note    Event Date/Time   First MD Initiated Contact with Patient 02/14/24 [phone]     (approximate)   History   Ear Pain   HPI  Dwayne Macdonald is a 43 y.o. male  with history of GERD and as listed in EMR presents to the emergency department for treatment and evaluation of ear pain and sensation of fullness that started earlier today. He feels it is clogged up.' No fever.    Physical Exam    Vitals:   02/14/24 1505  BP: 138/89  Pulse: 90  Resp: 18  Temp: 98.3 F (36.8 C)  SpO2: 97%    General: Awake, no distress.  CV:  Good peripheral perfusion.  Resp:  Normal effort.  Abd:  No distention.  Other:  Left TM cerumen impaction   ED Results / Procedures / Treatments   Labs (all labs ordered are listed, but only abnormal results are displayed)  Labs Reviewed - No data to display   EKG  Not indicated.   RADIOLOGY  Image and radiology report reviewed and interpreted by me. Radiology report consistent with the same.  Not indicated.  PROCEDURES:  Critical Care performed: No  Procedures   MEDICATIONS ORDERED IN ED:  Medications  carbamide peroxide (DEBROX) 6.5 % OTIC (EAR) solution 5 drop (has no administration in time range)  docusate (COLACE) 50 MG/5ML liquid 100 mg (100 mg Oral Given 02/14/24 1703)     IMPRESSION / MDM / ASSESSMENT AND PLAN / ED COURSE   I have reviewed the triage note and vital signs. Vital signs stable   Differential diagnosis includes, but is not limited to, serous OM, otitis media, cerumen impaction  Patient's presentation is most consistent with acute, uncomplicated illness.  43 year old male presents to the ER for ear fullness and mild pain. See HPI.  On exam, cerumen impaction of the left ear is noted. Colace and ear irrigation noted.  Cerumen partially removed. Scant amount of blood in EAC. TM intact. Plan will be to have him use Debrox prior to showering for the next  few days until ear is clear. Patient agreeable to the plan.      FINAL CLINICAL IMPRESSION(S) / ED DIAGNOSES   Final diagnoses:  Impacted cerumen of left ear     Rx / DC Orders   ED Discharge Orders     None        Note:  This document was prepared using Dragon voice recognition software and may include unintentional dictation errors.   Herlinda Kirk NOVAK, FNP 02/14/24 1813    Floy Roberts, MD 02/14/24 [phone]

## 2024-02-14 NOTE — Discharge Instructions (Signed)
 Put 5 drops of Debrox in your ear about 15 minutes prior to taking a shower.  Follow up with primary care if not improving over the next few days.

## 2024-02-14 NOTE — ED Triage Notes (Signed)
 Pt presents to the ED via POV from home with left ear pain and fullness that started earlier today.
